# Patient Record
Sex: Female | Born: 1960 | Race: Black or African American | Hispanic: No | Marital: Married | Smoking: Never smoker
Health system: Southern US, Community
[De-identification: ages and names within clinical notes are randomized; demographics above are authoritative.]

## PROBLEM LIST (undated history)

## (undated) DIAGNOSIS — I10 Essential (primary) hypertension: Secondary | ICD-10-CM

## (undated) DIAGNOSIS — I2510 Atherosclerotic heart disease of native coronary artery without angina pectoris: Secondary | ICD-10-CM

## (undated) DIAGNOSIS — J449 Chronic obstructive pulmonary disease, unspecified: Secondary | ICD-10-CM

## (undated) DIAGNOSIS — G4730 Sleep apnea, unspecified: Secondary | ICD-10-CM

## (undated) HISTORY — PX: CARDIAC CATHETERIZATION: SHX172

## (undated) HISTORY — PX: OTHER SURGICAL HISTORY: SHX169

## (undated) HISTORY — PX: DILATION AND CURETTAGE OF UTERUS: SHX78

---

## 1988-01-05 HISTORY — PX: TUBAL LIGATION: SHX77

## 2001-04-10 ENCOUNTER — Ambulatory Visit (HOSPITAL_COMMUNITY): Admission: RE | Admit: 2001-04-10 | Discharge: 2001-04-10 | Payer: Self-pay | Admitting: *Deleted

## 2001-04-10 ENCOUNTER — Encounter: Payer: Self-pay | Admitting: *Deleted

## 2001-06-14 ENCOUNTER — Ambulatory Visit (HOSPITAL_COMMUNITY): Admission: RE | Admit: 2001-06-14 | Discharge: 2001-06-14 | Payer: Self-pay | Admitting: Urology

## 2001-06-14 ENCOUNTER — Encounter: Payer: Self-pay | Admitting: Urology

## 2001-06-16 ENCOUNTER — Encounter: Payer: Self-pay | Admitting: Urology

## 2001-06-16 ENCOUNTER — Ambulatory Visit (HOSPITAL_COMMUNITY): Admission: RE | Admit: 2001-06-16 | Discharge: 2001-06-16 | Payer: Self-pay | Admitting: Urology

## 2001-09-29 ENCOUNTER — Inpatient Hospital Stay (HOSPITAL_COMMUNITY): Admission: EM | Admit: 2001-09-29 | Discharge: 2001-09-30 | Payer: Self-pay | Admitting: Cardiovascular Disease

## 2001-09-29 ENCOUNTER — Encounter (HOSPITAL_COMMUNITY): Admission: RE | Admit: 2001-09-29 | Discharge: 2001-10-29 | Payer: Self-pay | Admitting: Cardiology

## 2001-11-10 ENCOUNTER — Encounter (HOSPITAL_COMMUNITY): Admission: RE | Admit: 2001-11-10 | Discharge: 2001-12-10 | Payer: Self-pay | Admitting: Cardiology

## 2002-04-20 ENCOUNTER — Encounter: Payer: Self-pay | Admitting: Urology

## 2002-04-20 ENCOUNTER — Ambulatory Visit (HOSPITAL_COMMUNITY): Admission: RE | Admit: 2002-04-20 | Discharge: 2002-04-20 | Payer: Self-pay | Admitting: Urology

## 2002-08-23 ENCOUNTER — Emergency Department (HOSPITAL_COMMUNITY): Admission: EM | Admit: 2002-08-23 | Discharge: 2002-08-23 | Payer: Self-pay | Admitting: Emergency Medicine

## 2002-08-23 ENCOUNTER — Encounter: Payer: Self-pay | Admitting: Emergency Medicine

## 2007-07-05 ENCOUNTER — Ambulatory Visit (HOSPITAL_COMMUNITY): Admission: RE | Admit: 2007-07-05 | Discharge: 2007-07-05 | Payer: Self-pay | Admitting: Obstetrics and Gynecology

## 2007-08-07 ENCOUNTER — Encounter: Payer: Self-pay | Admitting: Obstetrics and Gynecology

## 2007-08-07 ENCOUNTER — Ambulatory Visit (HOSPITAL_COMMUNITY): Admission: RE | Admit: 2007-08-07 | Discharge: 2007-08-07 | Payer: Self-pay | Admitting: Obstetrics and Gynecology

## 2007-08-22 ENCOUNTER — Emergency Department (HOSPITAL_COMMUNITY): Admission: EM | Admit: 2007-08-22 | Discharge: 2007-08-22 | Payer: Self-pay | Admitting: Emergency Medicine

## 2007-09-08 ENCOUNTER — Ambulatory Visit (HOSPITAL_COMMUNITY): Admission: RE | Admit: 2007-09-08 | Discharge: 2007-09-08 | Payer: Self-pay | Admitting: Urology

## 2007-09-27 ENCOUNTER — Ambulatory Visit (HOSPITAL_COMMUNITY): Admission: RE | Admit: 2007-09-27 | Discharge: 2007-09-27 | Payer: Self-pay | Admitting: Urology

## 2007-09-28 ENCOUNTER — Ambulatory Visit (HOSPITAL_COMMUNITY): Admission: RE | Admit: 2007-09-28 | Discharge: 2007-09-28 | Payer: Self-pay | Admitting: Urology

## 2010-03-30 ENCOUNTER — Other Ambulatory Visit (HOSPITAL_COMMUNITY)
Admission: RE | Admit: 2010-03-30 | Discharge: 2010-03-30 | Disposition: A | Discharge status: Home / Self Care | Payer: Self-pay | Source: Ambulatory Visit | Attending: Obstetrics and Gynecology | Admitting: Obstetrics and Gynecology

## 2010-03-30 ENCOUNTER — Other Ambulatory Visit: Payer: Self-pay | Admitting: Obstetrics and Gynecology

## 2010-03-30 DIAGNOSIS — Z01419 Encounter for gynecological examination (general) (routine) without abnormal findings: Secondary | ICD-10-CM | POA: Insufficient documentation

## 2010-05-19 NOTE — Op Note (Signed)
NAMETRENITY, PHA               ACCOUNT NO.:  [id number]   MEDICAL RECORD NO.:  [id number]          PATIENT TYPE:  AMB   LOCATION:  DAY                           FACILITY:  [facility]   PHYSICIAN:  Tilda Burrow, M.D. DATE OF BIRTH:  01/04/1961   DATE OF PROCEDURE:  DATE OF DISCHARGE:                               OPERATIVE REPORT   PREOPERATIVE DIAGNOSES:  1. Menorrhagia.  2. Uterine fibroids.   POSTOPERATIVE DIAGNOSES:  1. Menorrhagia.  2. Uterine fibroids.  3. Chronic hypertension, suboptimal controlled.   PROCEDURE:  1. Hysteroscopy.  2. D&C.  3. Endometrial ablation.   SURGEON:  Tilda Burrow, MD   ASSISTANT:  None.   ANESTHESIA:  General with LMA.   COMPLICATIONS:  None.   FINDINGS:  Small uterus deviated to the left by fibroid deformity.  Intramural and submucous fibroids deforming endometrial lining.  Small  endometrial polyp found as well as excised.   DETAILS OF PROCEDURE:  The patient was taken to the operating room,  prepped and draped in the usual fashion.  General anesthesia was  introduced and achieved with the patient's induction notable for  significant blood pressure elevations to as high as 200/100 during the  immediate preop and induction phases.  Analgesia was controlled the  anesthesia intraoperatively.   The vaginal layer was prepped and draped, and the cervix grasped in the  left-sided position sounded to 11 cm and dilated serially with a 25-  [place] allowing introduction of rigid 30-degree operative hysteroscope,  which showed both a small endometrial polyp photographed in photo number  one and some irregular intramural fibroids that deformed the uterine  fundus, particularly on the left side.  On the right side, we were able  to identify the tubal ostia from the right tube.  Curettage was  performed obtaining a small amount of tissue fragments.  Repeat  hysteroscopy confirmed that the polyp had been removed.  The procedure  was then  performed in a standard fashion with Gynecare ThermaChoice III  endometrial ablation device inserted to 11 cm dilated with 8 mL of D5W  and then an 8 minute thermal sequence was performed with 87 degrees  centigrade reached with the intrauterine fluid.  Fluid was removed at  the end of the 8 minutes after completion of the cooling sequence and  all 8 mL were recovered.  Paracervical block with 20 mL of 1% Marcaine  solution with epinephrine was then injected around the cervix.  The  patient was then allowed to go to the recovery room in stable condition.   ESTIMATED BLOOD LOSS:  Minimal.   CONDITION IN THE RECOVERY ROOM:  Stable.   ADDENDUM:  Blood pressure issues will be brought to the attention of  [facility] for continued monitoring of blood  pressures and consideration of increasing dosing.  Weight loss of course  would be helpful.      Tilda Burrow, M.D.  Electronically Signed     JVF/MEDQ  D:  08/07/2007  T:  08/08/2007  Job:  [id number]   cc:   [facility]
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
## 2010-05-19 NOTE — H&P (Signed)
[name], HOWSER               ACCOUNT NO.:  [id number]   MEDICAL RECORD NO.:  [id number]          PATIENT TYPE:  AMB   LOCATION:  DAY                           FACILITY:  [facility]   PHYSICIAN:  Dennie Maizes, M.D.   DATE OF BIRTH:  29-Dec-1960   DATE OF ADMISSION:  09/27/2007  DATE OF DISCHARGE:  LH                              HISTORY & PHYSICAL   CHIEF COMPLAINT:  Intermittent right flank pain, right distal ureteral  calculus with obstruction.   HISTORY OF PRESENT ILLNESS:  This 50 year old female is known to me from  prior evaluation and treatment.  She has a history of recurrent  urolithiasis.  She has undergone ESL of right renal calculus in 2003.   She has been having intermittent right flank pain for about 2 weeks.  The pain became severe and she went to the emergency room last month.  She has been evaluated with a noncontrast CT scan of abdomen and pelvis.  This revealed a 5-mm size right distal ureteral calculi without  obstruction or hydronephrosis.  The patient has not passed a stone.  She  returned to the office and further evaluation and treatment.  There is  no history of fever, chills, voiding difficulty or gross hematuria.  X-  ray KUB area was done in the diagnostic center.  This revealed a 5 x 6-  mm size stone in the right distal ureteral area.  The patient is brought  to the short-stay center today for extracorporeal shock wave lithotripsy  of right distal ureteral calculus.   PAST MEDICAL HISTORY:  1. History of recurrent urolithiasis status post ESL of right renal      calculus in 2003.  2. History of type 2 diabetes mellitus.  3. Heart disease.  4. Hypertension.  5. Elevated cholesterol.   MEDICATIONS:  1. Metformin 1000 mg twice a day.  2. Pravastatin 20 mg at bedtime.  3. Multivitamins.  4. Hyzaar 100/12.7 p.o. daily.  5. Glipizide 10 mg twice a day.  6. Plavix 75 mg one p.o. daily.  7. Hydroxyzine hydrochloride 10 mg twice a day.  8. Carvedilol  6.25 mg twice a day.  9. Plavix has been stopped for a week prior to the procedure.   ALLERGIES:  None   PHYSICAL EXAMINATION:  GENERAL:  The patient is comfortable at time of  examination.  HEAD, EYES, EARS, NOSE AND THROAT:  Normal.  LUNGS:  Clear to auscultation.  HEART:  Regular rate and rhythm.  No murmurs.  ABDOMEN:  Soft.  No palpable flank mass or CVA tenderness.  Bladder is  not palpable.   IMPRESSION:  Right distal ureteral calculus with obstruction, right  renal colic, right hydronephrosis.   PLAN:  I have discussed with the patient regarding management options.  She would like to be treated with ESL.  Extracorporeal shock wave  lithotripsy of right distal ureteral calculus with IV sedation will be  done at [facility].  I have informed the patient regarding  diagnosis, operative details, alternative treatments, outcome, possible  risks and complications and she has agreed for the  procedure to be done.      Dennie Maizes, M.D.  Electronically Signed     SK/MEDQ  D:  09/26/2007  T:  09/26/2007  Job:  [id number]   cc:   [facility]  [place], [place]   Short stay Center
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
## 2010-05-22 NOTE — Procedures (Signed)
   NAME:  [name], [name]                          ACCOUNT NO.:  [id number]   MEDICAL RECORD NO.:  [id number]                   PATIENT TYPE:  OUT   LOCATION:  RAD                                  FACILITY:  [facility]   PHYSICIAN:  [place] Bing, M.D. [facility]           DATE OF BIRTH:  02-23-1960   DATE OF PROCEDURE:  DATE OF DISCHARGE:                                    STRESS TEST   EXERCISE CARDIOLITE:   BRIEF HISTORY:  The patient is a pleasant 50 year old female with a history  of coronary artery disease.  She underwent cardiac catheterization September  2003.  She was found to have a subtotal RCA that was not treated at that  time.  She had significant disease in her LAD and received two stents to the  LAD.  She had normal LV function.  She was recently seen in the office by  Dr. Dietrich Pates on October 27, 2001 for evaluation of chest pain and dyspnea.  She was scheduled for an exercise Cardiolite.   The patient reports having some chest pain and dyspnea the day prior to this  study however, she feels fine today.  Her baseline EKG showed sinus  bradycardia rate 46 beats per minute without ischemic changes, target heart  rate was 153 beats per minute, resting blood pressure is 118/80.   The patient was able to exercise for 5 minutes and 49 seconds reaching a  maximum heart rate of 140 beats per minute which was less than targeted  heart rate of 153 beats per minute.  She was injected at 4 minutes and 53  seconds into the study at which time her heart rate was 139 beats per  minute.  Her blood pressure was elevated at 200/90; this did return to a  more normal range of 156/88 in recovery.   There were no EKG changes.  The patient did have some shortness of breath  and fatigue as well as dizziness at the conclusion of the study however, she  had no chest pain and there were no significant EKG changes.  The images are  pending at time of this dictation, however, as noted, this was a  submaximal  study in that she did not reach her targeted heart rate.     Delton See, P.A. [facility]                  [place] Bing, M.D. [facility]    DR/MEDQ  D:  11/10/2001  T:  11/11/2001  Job:  [id number]   cc:   Colan Neptune, M.D.  [facility]  [address]  [place], [place] [address]
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
## 2010-05-22 NOTE — Discharge Summary (Signed)
NAME:  [name], [name]                          ACCOUNT NO.:  [id number]   MEDICAL RECORD NO.:  [id number]                   PATIENT TYPE:  INP   LOCATION:  6533                                 FACILITY:  [facility]   PHYSICIAN:  Joellyn Rued, P.A. [facility]              DATE OF BIRTH:  1960-02-02   DATE OF ADMISSION:  09/29/2001  DATE OF DISCHARGE:  09/30/2001                           DISCHARGE SUMMARY - REFERRING   HISTORY OF PRESENT ILLNESS:  The patient is a 50 year old black female who  was referred for an outpatient stress Cardiolite by her primary care  physician, Dr. Fredderick Severance of [facility]. The patient  describes  a history of anterior sharp chest discomfort radiating to  bilateral arm numbness associated with nausea, shortness of breath and  diaphoresis. Her first episode was approximately two months ago when she was  evaluated and released from the [facility] emergency room (records  unavailable).  She told to follow up with her primary care physician, which  she did approximately one month ago. Her primary care doctor in turn  referred her to Dr. Dietrich Pates for an outpatient stress  Cardiolite. It is  noted that she has not been seen in our office.   Since the initial presentation of chest  discomfort, the patient has  continued to have two to three episodes of chest discomfort a day. They  occur any time, however, she denies any nocturnal episodes. The duration of  the discomfort is usually less than 15 minutes, and she obtains relief with  rest and aspirin.   During her stress test, she exhibited severe chest discomfort reminiscent of  her prior symptoms. Her EKG at the beginning of stage 2 showed 2 to 2 mm ST  segment elevation in V3 through V6. The treadmill  was discontinued. She was  placed on oxygen. Her symptoms resolved  within less than  one to two  minutes and her EKGs returned to baseline. Dr. Daleen Squibb was informed and she was  transferred to [facility] emergently for cardiac catheterization.   PAST MEDICAL HISTORY:  Her history is notable for:  1. Cesarean section.  2. Bilateral tubal ligation.  3. Treatment for bronchitis.   LABORATORY DATA:  Labs were drawn initially in [facility] emergency room.  This showed an H&H of 12.6 and 37.4, normal indices, platelets 349, WBCs  4.6. PT 12.7, PTT 23. Sodium 138, potassium 39, BUN 6, creatinine 0.7.  Normal liver function tests. Serial CK isoenzymes were negative for  myocardial infarctions, two troponins were negative for myocardial  infarction. At the time of discharge H&H  was 10.7 and 32.0, normal indices,  platelets 231, WBCs 5.0. Sodium 134, potassium 34, BUN 10, creatinine 0.6.   HOSPITAL COURSE:  At [facility] she was placed on IV heparin and IV  nitroglycerin and given  four baby aspirin. She was transferred to [facility] via [facility] and taken immediately  to the cardiac  catheterization laboratory by Dr. Chales Abrahams. Her ejection fraction was 55%. She  had a 40% proximal RCA, a distal 100% PLA. She had a 30% mid circumflex. She  had an 80% proximal LAD and a 70% mid LAD. Dr. Chales Abrahams opened both LAD lesions  with a Zeta and a Cypher stent, reducing the lesions to 20% and 0%  respectively.  [place] resource placed  her on the jumbo study.   Post sheath removal she was on bedrest. She ambulating the halls without  difficulty. Cardiac rehab assisted with ambulation and education. Dr. Graciela Husbands,  after review on  September 30, 2001, felt that she could be discharged home.   DISCHARGE DIAGNOSIS:  Unstable angina, positive stress test with V3 through  V6 ST segment elevation (Cardiolite imaging was not performed). Coronary  artery disease as described, status post angioplasty stenting of the LAD.   DISPOSITION:  She is discharged home. It is noted that at the time of  discharge her lipid panel is pending.   DISCHARGE MEDICATIONS:  The jumbo study drug x 30 days. She  was instructed  no proton pump inhibitors and no Plavix while on the study drug. When she  finishes the study drug she will start:  1. Plavix 75 mg q.d.  2. She is asked to begin taking a coated aspirin 325 mg q.d.  3. Norvasc 5 mg q.d.  4. Nitroglycerin p.r.n.  5. Zocor 20 mg q.h.s.  6. She was given permission to continue her Ditropan XL  5 mg q.d.   DISCHARGE INSTRUCTIONS:  She was advised no lifting, driving, sexual  activity or heavy exertion for two days, no work for one week. No MI for two  months. Antibiotics with any procedures for  the next three months. Maintain  low fat, low salt, low cholesterol diet. If she had any problems with her  catheterization site she was asked to call. She was instructed not to take  her hydrochlorothiazide. We will have Dr. Elmer Ramp draw fasting lipids  and liver function tests in approximately six weeks and send to  our office  in [place].   FOLLOW UP:  She will have a research followup  on October 26, 2001, at 10  a.m. in the [place] office. She was asked to call  our [place] office  to arrange a one to two week followup appointment with Dr. Dietrich Pates or  myself.                                                 Joellyn Rued, P.A. [facility]    EW/MEDQ  D:  09/30/2001  T:  10/03/2001  Job:  [id number]   cc:   Dr. Linna Hoff [facility]

## 2010-10-02 LAB — CBC
MCHC: 33.3
MCV: 83.5
Platelets: 272
RDW: 14.1

## 2010-10-02 LAB — BASIC METABOLIC PANEL
BUN: 4 — ABNORMAL LOW
CO2: 28
Calcium: 9.4
Chloride: 104
Creatinine, Ser: 0.59
Glucose, Bld: 114 — ABNORMAL HIGH

## 2010-10-02 LAB — HCG, QUANTITATIVE, PREGNANCY: hCG, Beta Chain, Quant, S: <2

## 2010-10-05 LAB — BASIC METABOLIC PANEL
BUN: 9
Calcium: 9.2
Chloride: 102
Creatinine, Ser: 0.61
GFR calc Af Amer: >60
GFR calc non Af Amer: >60

## 2010-10-05 LAB — HCG, QUANTITATIVE, PREGNANCY: hCG, Beta Chain, Quant, S: <2

## 2010-12-17 ENCOUNTER — Inpatient Hospital Stay: Payer: Self-pay | Admitting: Internal Medicine

## 2011-01-07 ENCOUNTER — Emergency Department: Payer: Self-pay | Admitting: *Deleted

## 2011-04-08 ENCOUNTER — Other Ambulatory Visit: Payer: Self-pay | Admitting: Obstetrics and Gynecology

## 2011-04-09 ENCOUNTER — Encounter (HOSPITAL_COMMUNITY): Payer: Self-pay

## 2011-04-09 ENCOUNTER — Other Ambulatory Visit: Payer: Self-pay

## 2011-04-09 ENCOUNTER — Encounter (HOSPITAL_COMMUNITY)
Admission: RE | Admit: 2011-04-09 | Discharge: 2011-04-09 | Disposition: A | Discharge status: Home / Self Care | Payer: PRIVATE HEALTH INSURANCE | Source: Ambulatory Visit | Attending: Obstetrics and Gynecology | Admitting: Obstetrics and Gynecology

## 2011-04-09 HISTORY — DX: Essential (primary) hypertension: I10

## 2011-04-09 HISTORY — DX: Sleep apnea, unspecified: G47.30

## 2011-04-09 HISTORY — DX: Chronic obstructive pulmonary disease, unspecified: J44.9

## 2011-04-09 LAB — URINALYSIS, ROUTINE W REFLEX MICROSCOPIC
Leukocytes, UA: NEGATIVE
Nitrite: NEGATIVE
Protein, ur: NEGATIVE mg/dL
Specific Gravity, Urine: 1.02 (ref 1.005–1.030)
Urobilinogen, UA: 0.2 mg/dL (ref 0.0–1.0)

## 2011-04-09 LAB — SURGICAL PCR SCREEN
MRSA, PCR: NEGATIVE
Staphylococcus aureus: NEGATIVE

## 2011-04-09 LAB — CBC
HCT: 31 % — ABNORMAL LOW (ref 36.0–46.0)
Hemoglobin: 9.8 g/dL — ABNORMAL LOW (ref 12.0–15.0)
RDW: 16.3 % — ABNORMAL HIGH (ref 11.5–15.5)
WBC: 5.8 10*3/uL (ref 4.0–10.5)

## 2011-04-09 LAB — HCG, SERUM, QUALITATIVE: Preg, Serum: NEGATIVE

## 2011-04-09 LAB — BASIC METABOLIC PANEL
Chloride: 102 mEq/L (ref 96–112)
Creatinine, Ser: 0.65 mg/dL (ref 0.50–1.10)
GFR calc Af Amer: >90 mL/min (ref >90)
GFR calc non Af Amer: >90 mL/min (ref >90)
Potassium: 3.6 mEq/L (ref 3.5–5.1)

## 2011-04-09 NOTE — Progress Notes (Signed)
0400 Spoke to Dr. Emelda Fear about patient taking Aspirin and Plavix. Gave order for patient to discontinue  Plavix and Aspirin after Friday, April 5 dose for surgery on April 9.

## 2011-04-09 NOTE — Patient Instructions (Addendum)
20 [name]  04/09/2011   Your procedure is scheduled on:  Tuesday, April 13, 2011  [facility] to [facility] at [id number].  Call this number if you have problems the morning of surgery: [phone]   Remember:   Do not eat food:After Midnight.  May have clear liquids:until Midnight .  Clear liquids include soda, tea, black coffee, apple or grape juice, broth.  Take these medicines the morning of surgery with A SIP OF WATER:    Do not wear jewelry, make-up or nail polish.  Do not wear lotions, powders, or perfumes. You may wear deodorant.  Do not shave 48 hours prior to surgery.  Do not bring valuables to the hospital.  Contacts, dentures or bridgework may not be worn into surgery.  Leave suitcase in the car. After surgery it may be brought to your room.  For patients admitted to the hospital, checkout time is 11:00 AM the day of discharge.   Patients discharged the day of surgery will not be allowed to drive home.  Name and phone number of your driver: Family  Special Instructions: CHG Shower Use Special Wash: 1/2 bottle night before surgery and 1/2 bottle morning of surgery.   Please read over the following fact sheets that you were given: Pain Booklet, Coughing and Deep Breathing, Blood Transfusion Information, MRSA Information, Surgical Site Infection Prevention, Anesthesia Post-op Instructions and Care and Recovery After Surgery   PATIENT INSTRUCTIONS POST-ANESTHESIA  IMMEDIATELY FOLLOWING SURGERY:  Do not drive or operate machinery for the first twenty four hours after surgery.  Do not make any important decisions for twenty four hours after surgery or while taking narcotic pain medications or sedatives.  If you develop intractable nausea and vomiting or a severe headache please notify your doctor immediately.  FOLLOW-UP:  Please make an appointment with your surgeon as instructed. You do not need to follow up with anesthesia unless specifically instructed to do so.  WOUND CARE  INSTRUCTIONS (if applicable):  Keep a dry clean dressing on the anesthesia/puncture wound site if there is drainage.  Once the wound has quit draining you may leave it open to air.  Generally you should leave the bandage intact for twenty four hours unless there is drainage.  If the epidural site drains for more than 36-48 hours please call the anesthesia department.  QUESTIONS?:  Please feel free to call your physician or the hospital operator if you have any questions, and they will be happy to assist you.      Hysterectomy Information  A hysterectomy is a procedure where your uterus is surgically removed. It will no longer be possible to have menstrual periods or to become pregnant. The tubes and ovaries can be removed (bilateral salpingo-oopherectomy) during this surgery as well.  REASONS FOR A HYSTERECTOMY  Persistent, abnormal bleeding.   Lasting (chronic) pelvic pain or infection.   The lining of the uterus (endometrium) starts growing outside the uterus (endometriosis).   The endometrium starts growing in the muscle of the uterus (adenomyosis).   The uterus falls down into the vagina (pelvic organ prolapse).   Symptomatic uterine fibroids.   Precancerous cells.   Cervical cancer or uterine cancer.  TYPES OF HYSTERECTOMIES  Supracervical hysterectomy. This type removes the top part of the uterus, but not the cervix.   Total hysterectomy. This type removes the uterus and cervix.   Radical hysterectomy. This type removes the uterus, cervix, and the fibrous tissue that holds the uterus in place in the pelvis (  parametrium).  WAYS A HYSTERECTOMY CAN BE PERFORMED  Abdominal hysterectomy. A large surgical cut (incision) is made in the abdomen. The uterus is removed through this incision.   Vaginal hysterectomy. An incision is made in the vagina. The uterus is removed through this incision. There are no abdominal incisions.   Conventional laparoscopic hysterectomy. A thin, lighted  tube with a camera (laparoscope) is inserted into 3 or 4 small incisions in the abdomen. The uterus is cut into small pieces. The small pieces are removed through the incisions, or they are removed through the vagina.   Laparoscopic assisted vaginal hysterectomy (LAVH). Three or four small incisions are made in the abdomen. Part of the surgery is performed laparoscopically and part vaginally. The uterus is removed through the vagina.   Robot-assisted laparoscopic hysterectomy. A laparoscope is inserted into 3 or 4 small incisions in the abdomen. A computer-controlled device is used to give the surgeon a 3D image. This allows for more precise movements of surgical instruments. The uterus is cut into small pieces and removed through the incisions or removed through the vagina.  RISKS OF HYSTERECTOMY   Bleeding and risk of blood transfusion. Tell your caregiver if you do not want to receive any blood products.   Blood clots in the legs or lung.   Infection.   Injury to surrounding organs.   Anesthesia problems or side effects.   Conversion to an abdominal hysterectomy.  WHAT TO EXPECT AFTER A HYSTERECTOMY  You will be given pain medicine.   You will need to have someone with you for the first 3 to 5 days after you go home.   You will need to follow up with your surgeon in 2 to 4 weeks after surgery to evaluate your progress.   You may have early menopause symptoms like hot flashes, night sweats, and insomnia.   If you had a hysterectomy for a problem that was not a cancer or a condition that could lead to cancer, then you no longer need Pap tests. However, even if you no longer need a Pap test, a regular exam is a good idea to make sure no other problems are starting.  Document Released: 06/16/2000 Document Revised: 12/10/2010 Document Reviewed: 08/01/2010 [facility] Patient Information 2012 [place], [place].

## 2011-04-13 ENCOUNTER — Encounter (HOSPITAL_COMMUNITY)
Admission: RE | Disposition: A | Discharge status: Home / Self Care | Payer: Self-pay | Source: Ambulatory Visit | Attending: Obstetrics and Gynecology

## 2011-04-13 ENCOUNTER — Ambulatory Visit (HOSPITAL_COMMUNITY)
Admission: RE | Admit: 2011-04-13 | Discharge: 2011-04-13 | Disposition: A | Discharge status: Home / Self Care | Payer: PRIVATE HEALTH INSURANCE | Source: Ambulatory Visit | Attending: Obstetrics and Gynecology | Admitting: Obstetrics and Gynecology

## 2011-04-13 ENCOUNTER — Encounter (HOSPITAL_COMMUNITY): Payer: Self-pay | Admitting: Anesthesiology

## 2011-04-13 ENCOUNTER — Other Ambulatory Visit: Payer: Self-pay | Admitting: Obstetrics and Gynecology

## 2011-04-13 ENCOUNTER — Encounter (HOSPITAL_COMMUNITY): Payer: Self-pay | Admitting: *Deleted

## 2011-04-13 DIAGNOSIS — Z01812 Encounter for preprocedural laboratory examination: Secondary | ICD-10-CM | POA: Insufficient documentation

## 2011-04-13 DIAGNOSIS — N920 Excessive and frequent menstruation with regular cycle: Secondary | ICD-10-CM | POA: Insufficient documentation

## 2011-04-13 DIAGNOSIS — Z5309 Procedure and treatment not carried out because of other contraindication: Secondary | ICD-10-CM | POA: Insufficient documentation

## 2011-04-13 LAB — ABO/RH: ABO/RH(D): B POS

## 2011-04-13 SURGERY — HYSTERECTOMY, ABDOMINAL
Anesthesia: General

## 2011-04-13 MED ORDER — CEFAZOLIN SODIUM 1-5 GM-% IV SOLN
INTRAVENOUS | Status: AC
  Filled 2011-04-13: qty 100

## 2011-04-13 MED ORDER — ROCURONIUM BROMIDE 50 MG/5ML IV SOLN
INTRAVENOUS | Status: AC
  Filled 2011-04-13: qty 1

## 2011-04-13 MED ORDER — PROPOFOL 10 MG/ML IV EMUL
INTRAVENOUS | Status: AC
  Filled 2011-04-13: qty 20

## 2011-04-13 MED ORDER — LIDOCAINE HCL (PF) 1 % IJ SOLN
INTRAMUSCULAR | Status: AC
  Filled 2011-04-13: qty 5

## 2011-04-13 MED ORDER — CEFAZOLIN SODIUM-DEXTROSE 2-3 GM-% IV SOLR: 2.0000 g | INTRAVENOUS | Status: DC

## 2011-04-13 SURGICAL SUPPLY — 77 items
ADH SKN CLS APL DERMABOND .7 (GAUZE/BANDAGES/DRESSINGS)
APL SKNCLS STERI-STRIP NONHPOA (GAUZE/BANDAGES/DRESSINGS)
APPLIER CLIP 11 MED OPEN (CLIP)
APPLIER CLIP 13 LRG OPEN (CLIP)
APR CLP LRG 13 20 CLIP (CLIP)
APR CLP MED 11 20 MLT OPN (CLIP)
BAG HAMPER (MISCELLANEOUS) ×1 IMPLANT
BENZOIN TINCTURE PRP APPL 2/3 (GAUZE/BANDAGES/DRESSINGS) IMPLANT
BLADE SURG SZ11 CARB STEEL (BLADE) IMPLANT
BRR ADH 6X5 SEPRAFILM 1 SHT (MISCELLANEOUS)
CELLS DAT CNTRL 66122 CELL SVR (MISCELLANEOUS) IMPLANT
CLIP APPLIE 11 MED OPEN (CLIP) IMPLANT
CLIP APPLIE 13 LRG OPEN (CLIP) IMPLANT
CLOTH BEACON ORANGE TIMEOUT ST (SAFETY) ×1 IMPLANT
COVER SURGICAL LIGHT HANDLE (MISCELLANEOUS) ×2 IMPLANT
DERMABOND ADVANCED (GAUZE/BANDAGES/DRESSINGS)
DERMABOND ADVANCED .7 DNX12 (GAUZE/BANDAGES/DRESSINGS) IMPLANT
DRAPE WARM FLUID 44X44 (DRAPE) ×1 IMPLANT
DRESSING TELFA 8X3 (GAUZE/BANDAGES/DRESSINGS) ×1 IMPLANT
ELECT REM PT RETURN 9FT ADLT (ELECTROSURGICAL)
ELECTRODE REM PT RTRN 9FT ADLT (ELECTROSURGICAL) ×1 IMPLANT
EVACUATOR DRAINAGE 10X20 100CC (DRAIN) IMPLANT
EVACUATOR SILICONE 100CC (DRAIN)
FORMALIN 10 PREFIL 120ML (MISCELLANEOUS) ×1 IMPLANT
FORMALIN 10 PREFIL 480ML (MISCELLANEOUS) ×1 IMPLANT
GLOVE ECLIPSE 9.0 STRL (GLOVE) ×1 IMPLANT
GLOVE INDICATOR STER SZ 9 (GLOVE) IMPLANT
GOWN STRL REIN 3XL LVL4 (GOWN DISPOSABLE) IMPLANT
GOWN STRL REIN XL XLG (GOWN DISPOSABLE) ×2 IMPLANT
INST SET MAJOR GENERAL (KITS) IMPLANT
INST SET MINOR GENERAL (KITS) IMPLANT
KIT ROOM TURNOVER APOR (KITS) ×1 IMPLANT
MANIFOLD NEPTUNE II (INSTRUMENTS) ×1 IMPLANT
NDL HYPO 25X1 1.5 SAFETY (NEEDLE) ×1 IMPLANT
NEEDLE HYPO 25X1 1.5 SAFETY (NEEDLE) IMPLANT
NS IRRIG 1000ML POUR BTL (IV SOLUTION) IMPLANT
PACK ABDOMINAL MAJOR (CUSTOM PROCEDURE TRAY) ×1 IMPLANT
PACK MINOR (CUSTOM PROCEDURE TRAY) ×1 IMPLANT
PAD ARMBOARD 7.5X6 YLW CONV (MISCELLANEOUS) ×1 IMPLANT
RETRACTOR WND ALEXIS 18 MED (MISCELLANEOUS) IMPLANT
RETRACTOR WND ALEXIS 25 LRG (MISCELLANEOUS) IMPLANT
RTRCTR WOUND ALEXIS 18CM MED (MISCELLANEOUS)
RTRCTR WOUND ALEXIS 25CM LRG (MISCELLANEOUS)
SEPRAFILM MEMBRANE 5X6 (MISCELLANEOUS) IMPLANT
SET BASIN LINEN APH (SET/KITS/TRAYS/PACK) IMPLANT
SOL PREP PROV IODINE SCRUB 4OZ (MISCELLANEOUS) ×1 IMPLANT
SPONGE DRAIN TRACH 4X4 STRL 2S (GAUZE/BANDAGES/DRESSINGS) IMPLANT
SPONGE GAUZE 2X2 8PLY STRL LF (GAUZE/BANDAGES/DRESSINGS) ×1 IMPLANT
SPONGE GAUZE 4X4 12PLY (GAUZE/BANDAGES/DRESSINGS) IMPLANT
SPONGE LAP 18X18 X RAY DECT (DISPOSABLE) IMPLANT
STAPLER VISISTAT (STAPLE) ×1 IMPLANT
STAPLER VISISTAT 35W (STAPLE) IMPLANT
STRIP CLOSURE SKIN 1/2X4 (GAUZE/BANDAGES/DRESSINGS) ×2 IMPLANT
SUT CHROMIC 0 CT 1 (SUTURE) IMPLANT
SUT CHROMIC 2 0 CT 1 (SUTURE) IMPLANT
SUT CHROMIC GUT BROWN 0 54 (SUTURE) IMPLANT
SUT CHROMIC GUT BROWN 0 54IN (SUTURE)
SUT ETHIBOND NAB MO 7 #0 18IN (SUTURE) ×1 IMPLANT
SUT ETHILON 3 0 FSL (SUTURE) IMPLANT
SUT PDS AB CT VIOLET #0 27IN (SUTURE) IMPLANT
SUT PLAIN CT 1/2CIR 2-0 27IN (SUTURE) IMPLANT
SUT PROLENE 0 CT 1 30 (SUTURE) IMPLANT
SUT VIC AB 0 CT1 27 (SUTURE)
SUT VIC AB 0 CT1 27XBRD ANTBC (SUTURE) IMPLANT
SUT VIC AB 2-0 CT1 27 (SUTURE)
SUT VIC AB 2-0 CT1 TAPERPNT 27 (SUTURE) IMPLANT
SUT VIC AB 2-0 CT2 27 (SUTURE) ×1 IMPLANT
SUT VIC AB 3-0 SH 27 (SUTURE)
SUT VIC AB 3-0 SH 27X BRD (SUTURE) ×1 IMPLANT
SUT VIC AB 4-0 PS2 27 (SUTURE) ×1 IMPLANT
SUT VIC AB 5-0 P-3 18X BRD (SUTURE) IMPLANT
SUT VIC AB 5-0 P3 18 (SUTURE)
SUT VICRYL 4 0 KS 27 (SUTURE) ×1 IMPLANT
SUT VICRYL AB 3 0 TIES (SUTURE) IMPLANT
SYR CONTROL 10ML LL (SYRINGE) ×1 IMPLANT
TOWEL BLUE STERILE X RAY DET (MISCELLANEOUS) IMPLANT
TRAY FOLEY CATH 14FR (SET/KITS/TRAYS/PACK) IMPLANT

## 2011-04-13 NOTE — Anesthesia Preprocedure Evaluation (Signed)
Anesthesia Evaluation  Patient identified by MRN, date of birth, ID band Patient awake    Reviewed: Allergy & Precautions, H&P , NPO status , Patient's Chart, lab work & pertinent test results  History of Anesthesia Complications Negative for: history of anesthetic complications  Airway Mallampati: II      Dental  (+) Edentulous Upper   Pulmonary asthma , sleep apnea , COPD breath sounds clear to auscultation        Cardiovascular hypertension, Pt. on medications + CAD and + Cardiac Stents Rhythm:Regular Rate:Normal     Neuro/Psych    GI/Hepatic   Endo/Other  Diabetes mellitus-, Well Controlled, Type 2, Oral Hypoglycemic Agents  Renal/GU      Musculoskeletal   Abdominal   Peds  Hematology   Anesthesia Other Findings   Reproductive/Obstetrics                           Anesthesia Physical Anesthesia Plan  ASA: III  Anesthesia Plan: General   Post-op Pain Management:    Induction: Intravenous  Airway Management Planned: Oral ETT  Additional Equipment:   Intra-op Plan:   Post-operative Plan: Extubation in OR  Informed Consent: I have reviewed the patients History and Physical, chart, labs and discussed the procedure including the risks, benefits and alternatives for the proposed anesthesia with the patient or authorized representative who has indicated his/her understanding and acceptance.     Plan Discussed with:   Anesthesia Plan Comments:         Anesthesia Quick Evaluation

## 2011-04-13 NOTE — Brief Op Note (Signed)
Dr  Emelda Fear in to talk with pt surgery canceled due to pt  Not stopping plavix  With 10 days  Pt to be rescheduled at a later date

## 2011-04-15 ENCOUNTER — Encounter (HOSPITAL_COMMUNITY)
Admission: RE | Admit: 2011-04-15 | Discharge: 2011-04-15 | Payer: PRIVATE HEALTH INSURANCE | Source: Ambulatory Visit | Admitting: Obstetrics and Gynecology

## 2011-04-15 NOTE — Pre-Procedure Instructions (Addendum)
Called patient to notify her of time and date of surgery. Also instructed her on NPO after midnight.Patient instructed to take Coreg with a sip of water and to take albuterol inhaler before she comes and bring it with her. Patient also aware of bowel prep that she states Dr Emelda Fear instructed her on. Patient verbalized understanding of all information.

## 2011-04-19 ENCOUNTER — Inpatient Hospital Stay (HOSPITAL_COMMUNITY)
Admission: RE | Admit: 2011-04-19 | Discharge: 2011-04-21 | DRG: 742 | Disposition: A | Discharge status: Home / Self Care | Payer: PRIVATE HEALTH INSURANCE | Source: Ambulatory Visit | Attending: Obstetrics and Gynecology | Admitting: Obstetrics and Gynecology

## 2011-04-19 ENCOUNTER — Encounter (HOSPITAL_COMMUNITY): Payer: Self-pay | Admitting: *Deleted

## 2011-04-19 ENCOUNTER — Encounter (HOSPITAL_COMMUNITY)
Admission: RE | Disposition: A | Discharge status: Home / Self Care | Payer: Self-pay | Source: Ambulatory Visit | Attending: Obstetrics and Gynecology

## 2011-04-19 ENCOUNTER — Encounter (HOSPITAL_COMMUNITY): Payer: Self-pay | Admitting: Anesthesiology

## 2011-04-19 ENCOUNTER — Inpatient Hospital Stay (HOSPITAL_COMMUNITY): Payer: PRIVATE HEALTH INSURANCE | Admitting: Anesthesiology

## 2011-04-19 DIAGNOSIS — E119 Type 2 diabetes mellitus without complications: Secondary | ICD-10-CM | POA: Diagnosis present

## 2011-04-19 DIAGNOSIS — I2510 Atherosclerotic heart disease of native coronary artery without angina pectoris: Secondary | ICD-10-CM | POA: Diagnosis present

## 2011-04-19 DIAGNOSIS — G4730 Sleep apnea, unspecified: Secondary | ICD-10-CM | POA: Diagnosis present

## 2011-04-19 DIAGNOSIS — Z7982 Long term (current) use of aspirin: Secondary | ICD-10-CM

## 2011-04-19 DIAGNOSIS — K429 Umbilical hernia without obstruction or gangrene: Secondary | ICD-10-CM | POA: Diagnosis present

## 2011-04-19 DIAGNOSIS — N8000 Endometriosis of the uterus, unspecified: Principal | ICD-10-CM | POA: Diagnosis present

## 2011-04-19 DIAGNOSIS — Z6841 Body Mass Index (BMI) 40.0 and over, adult: Secondary | ICD-10-CM

## 2011-04-19 DIAGNOSIS — I10 Essential (primary) hypertension: Secondary | ICD-10-CM | POA: Diagnosis present

## 2011-04-19 DIAGNOSIS — N938 Other specified abnormal uterine and vaginal bleeding: Secondary | ICD-10-CM | POA: Diagnosis present

## 2011-04-19 DIAGNOSIS — N926 Irregular menstruation, unspecified: Secondary | ICD-10-CM | POA: Diagnosis present

## 2011-04-19 DIAGNOSIS — Z9861 Coronary angioplasty status: Secondary | ICD-10-CM

## 2011-04-19 DIAGNOSIS — N949 Unspecified condition associated with female genital organs and menstrual cycle: Secondary | ICD-10-CM | POA: Diagnosis present

## 2011-04-19 DIAGNOSIS — E669 Obesity, unspecified: Secondary | ICD-10-CM | POA: Diagnosis present

## 2011-04-19 DIAGNOSIS — N856 Intrauterine synechiae: Secondary | ICD-10-CM | POA: Diagnosis present

## 2011-04-19 DIAGNOSIS — Z9851 Tubal ligation status: Secondary | ICD-10-CM

## 2011-04-19 DIAGNOSIS — D251 Intramural leiomyoma of uterus: Secondary | ICD-10-CM | POA: Diagnosis present

## 2011-04-19 DIAGNOSIS — J449 Chronic obstructive pulmonary disease, unspecified: Secondary | ICD-10-CM | POA: Diagnosis present

## 2011-04-19 DIAGNOSIS — K432 Incisional hernia without obstruction or gangrene: Secondary | ICD-10-CM | POA: Diagnosis present

## 2011-04-19 DIAGNOSIS — D259 Leiomyoma of uterus, unspecified: Secondary | ICD-10-CM | POA: Diagnosis present

## 2011-04-19 DIAGNOSIS — Z79899 Other long term (current) drug therapy: Secondary | ICD-10-CM

## 2011-04-19 DIAGNOSIS — Z7902 Long term (current) use of antithrombotics/antiplatelets: Secondary | ICD-10-CM

## 2011-04-19 DIAGNOSIS — N946 Dysmenorrhea, unspecified: Secondary | ICD-10-CM | POA: Diagnosis present

## 2011-04-19 DIAGNOSIS — J4489 Other specified chronic obstructive pulmonary disease: Secondary | ICD-10-CM | POA: Diagnosis present

## 2011-04-19 DIAGNOSIS — Z90710 Acquired absence of both cervix and uterus: Secondary | ICD-10-CM

## 2011-04-19 HISTORY — PX: UMBILICAL HERNIA REPAIR: SHX196

## 2011-04-19 HISTORY — PX: ABDOMINAL HYSTERECTOMY: SHX81

## 2011-04-19 LAB — PREPARE RBC (CROSSMATCH)

## 2011-04-19 LAB — GLUCOSE, CAPILLARY
Glucose-Capillary: 141 mg/dL — ABNORMAL HIGH (ref 70–99)
Glucose-Capillary: 185 mg/dL — ABNORMAL HIGH (ref 70–99)

## 2011-04-19 LAB — HEMOGLOBIN: Hemoglobin: 12.9 g/dL (ref 12.0–15.0)

## 2011-04-19 SURGERY — HYSTERECTOMY, ABDOMINAL
Anesthesia: General | Site: Abdomen | Wound class: Clean Contaminated

## 2011-04-19 MED ORDER — CEFAZOLIN SODIUM-DEXTROSE 2-3 GM-% IV SOLR: 2.0000 g | Freq: Once | INTRAVENOUS | Status: DC

## 2011-04-19 MED ORDER — DIPHENHYDRAMINE HCL 50 MG/ML IJ SOLN
25.0000 mg | Freq: Once | INTRAMUSCULAR | Status: AC
  Administered 2011-04-19: 25 mg via INTRAVENOUS

## 2011-04-19 MED ORDER — GLYCOPYRROLATE 0.2 MG/ML IJ SOLN
INTRAMUSCULAR | Status: AC
  Filled 2011-04-19: qty 3

## 2011-04-19 MED ORDER — METFORMIN HCL 500 MG PO TABS
1000.0000 mg | ORAL_TABLET | Freq: Two times a day (BID) | ORAL | Status: DC
  Administered 2011-04-19 – 2011-04-21 (×4): 1000 mg via ORAL
  Filled 2011-04-19 (×4): qty 2

## 2011-04-19 MED ORDER — ROCURONIUM BROMIDE 50 MG/5ML IV SOLN
INTRAVENOUS | Status: AC
  Filled 2011-04-19: qty 1

## 2011-04-19 MED ORDER — ONDANSETRON HCL 4 MG/2ML IJ SOLN: 4.0000 mg | Freq: Four times a day (QID) | INTRAMUSCULAR | Status: DC | PRN

## 2011-04-19 MED ORDER — CARVEDILOL 12.5 MG PO TABS
6.2500 mg | ORAL_TABLET | Freq: Every day | ORAL | Status: DC
  Administered 2011-04-19 – 2011-04-20 (×2): 6.25 mg via ORAL
  Filled 2011-04-19 (×2): qty 1

## 2011-04-19 MED ORDER — OXYCODONE-ACETAMINOPHEN 5-325 MG PO TABS: 1.0000 | ORAL_TABLET | ORAL | Status: DC | PRN

## 2011-04-19 MED ORDER — LACTATED RINGERS IV SOLN
INTRAVENOUS | Status: DC | PRN
  Administered 2011-04-19 (×3): via INTRAVENOUS

## 2011-04-19 MED ORDER — KETOROLAC TROMETHAMINE 30 MG/ML IJ SOLN: 30.0000 mg | Freq: Four times a day (QID) | INTRAMUSCULAR | Status: DC

## 2011-04-19 MED ORDER — ONDANSETRON HCL 4 MG/2ML IJ SOLN
4.0000 mg | Freq: Four times a day (QID) | INTRAMUSCULAR | Status: DC | PRN
  Administered 2011-04-20: 4 mg via INTRAVENOUS
  Filled 2011-04-19: qty 2

## 2011-04-19 MED ORDER — FENTANYL CITRATE 0.05 MG/ML IJ SOLN
25.0000 ug | INTRAMUSCULAR | Status: DC | PRN
  Administered 2011-04-19 (×4): 50 ug via INTRAVENOUS

## 2011-04-19 MED ORDER — KETOROLAC TROMETHAMINE 30 MG/ML IJ SOLN
30.0000 mg | Freq: Four times a day (QID) | INTRAMUSCULAR | Status: AC
  Administered 2011-04-19 – 2011-04-20 (×5): 30 mg via INTRAVENOUS
  Filled 2011-04-19 (×4): qty 1

## 2011-04-19 MED ORDER — DIPHENHYDRAMINE HCL 50 MG/ML IJ SOLN
INTRAMUSCULAR | Status: AC
  Filled 2011-04-19: qty 1

## 2011-04-19 MED ORDER — PROPOFOL 10 MG/ML IV EMUL
INTRAVENOUS | Status: DC | PRN
  Administered 2011-04-19: 150 mg via INTRAVENOUS

## 2011-04-19 MED ORDER — SODIUM CHLORIDE 0.9 % IV SOLN
INTRAVENOUS | Status: DC | PRN
  Administered 2011-04-19: 10:00:00 via INTRAVENOUS

## 2011-04-19 MED ORDER — MIDAZOLAM HCL 2 MG/2ML IJ SOLN: 1.0000 mg | INTRAMUSCULAR | Status: DC | PRN

## 2011-04-19 MED ORDER — SIMVASTATIN 20 MG PO TABS
20.0000 mg | ORAL_TABLET | Freq: Every day | ORAL | Status: DC
  Administered 2011-04-19 – 2011-04-20 (×2): 20 mg via ORAL
  Filled 2011-04-19 (×2): qty 1

## 2011-04-19 MED ORDER — SODIUM CHLORIDE 0.9 % IJ SOLN: 9.0000 mL | INTRAMUSCULAR | Status: DC | PRN

## 2011-04-19 MED ORDER — ONDANSETRON HCL 4 MG/2ML IJ SOLN
4.0000 mg | Freq: Once | INTRAMUSCULAR | Status: AC
  Administered 2011-04-19: 4 mg via INTRAVENOUS

## 2011-04-19 MED ORDER — SIMETHICONE 80 MG PO CHEW: 80.0000 mg | CHEWABLE_TABLET | Freq: Four times a day (QID) | ORAL | Status: DC | PRN

## 2011-04-19 MED ORDER — DIPHENHYDRAMINE HCL 50 MG/ML IJ SOLN: 12.5000 mg | Freq: Four times a day (QID) | INTRAMUSCULAR | Status: DC | PRN

## 2011-04-19 MED ORDER — ONDANSETRON HCL 4 MG/2ML IJ SOLN: 4.0000 mg | Freq: Once | INTRAMUSCULAR | Status: DC | PRN

## 2011-04-19 MED ORDER — POTASSIUM CHLORIDE IN NACL 20-0.9 MEQ/L-% IV SOLN
INTRAVENOUS | Status: DC
Start: 2011-04-19 — End: 2011-04-21
  Administered 2011-04-19 – 2011-04-20 (×4): via INTRAVENOUS

## 2011-04-19 MED ORDER — GLYCOPYRROLATE 0.2 MG/ML IJ SOLN: 0.2000 mg | Freq: Once | INTRAMUSCULAR | Status: DC

## 2011-04-19 MED ORDER — MIDAZOLAM HCL 2 MG/2ML IJ SOLN
INTRAMUSCULAR | Status: AC
  Administered 2011-04-19: 2 mg via INTRAVENOUS
  Filled 2011-04-19: qty 2

## 2011-04-19 MED ORDER — LACTATED RINGERS IV SOLN
INTRAVENOUS | Status: DC
  Administered 2011-04-19: 1000 mL via INTRAVENOUS

## 2011-04-19 MED ORDER — CEFAZOLIN SODIUM 1-5 GM-% IV SOLN
INTRAVENOUS | Status: AC
  Filled 2011-04-19: qty 100

## 2011-04-19 MED ORDER — SUFENTANIL CITRATE 50 MCG/ML IV SOLN
INTRAVENOUS | Status: DC | PRN
  Administered 2011-04-19: 10 ug via INTRAVENOUS
  Administered 2011-04-19: 20 ug via INTRAVENOUS
  Administered 2011-04-19 (×3): 10 ug via INTRAVENOUS
  Administered 2011-04-19: 20 ug via INTRAVENOUS
  Administered 2011-04-19 (×2): 10 ug via INTRAVENOUS

## 2011-04-19 MED ORDER — NALOXONE HCL 0.4 MG/ML IJ SOLN: 0.4000 mg | INTRAMUSCULAR | Status: DC | PRN

## 2011-04-19 MED ORDER — LIDOCAINE HCL (CARDIAC) 10 MG/ML IV SOLN
INTRAVENOUS | Status: DC | PRN
  Administered 2011-04-19: 50 mg via INTRAVENOUS

## 2011-04-19 MED ORDER — FENTANYL CITRATE 0.05 MG/ML IJ SOLN: 25.0000 ug | INTRAMUSCULAR | Status: DC | PRN

## 2011-04-19 MED ORDER — FENTANYL CITRATE 0.05 MG/ML IJ SOLN
INTRAMUSCULAR | Status: AC
  Administered 2011-04-19: 50 ug via INTRAVENOUS
  Filled 2011-04-19: qty 2

## 2011-04-19 MED ORDER — PROPOFOL 10 MG/ML IV EMUL
INTRAVENOUS | Status: AC
  Filled 2011-04-19: qty 20

## 2011-04-19 MED ORDER — CEFAZOLIN SODIUM 1-5 GM-% IV SOLN
INTRAVENOUS | Status: DC | PRN
  Administered 2011-04-19: 2 g via INTRAVENOUS

## 2011-04-19 MED ORDER — LABETALOL HCL 5 MG/ML IV SOLN
INTRAVENOUS | Status: DC | PRN
  Administered 2011-04-19 (×3): 5 mg via INTRAVENOUS

## 2011-04-19 MED ORDER — SUFENTANIL CITRATE 50 MCG/ML IV SOLN
INTRAVENOUS | Status: AC
  Filled 2011-04-19: qty 1

## 2011-04-19 MED ORDER — DIPHENHYDRAMINE HCL 12.5 MG/5ML PO ELIX: 12.5000 mg | ORAL_SOLUTION | Freq: Four times a day (QID) | ORAL | Status: DC | PRN

## 2011-04-19 MED ORDER — PANTOPRAZOLE SODIUM 40 MG IV SOLR: 40.0000 mg | Freq: Every day | INTRAVENOUS | Status: DC

## 2011-04-19 MED ORDER — GLYCOPYRROLATE 0.2 MG/ML IJ SOLN
INTRAMUSCULAR | Status: DC | PRN
  Administered 2011-04-19: 0.6 mg via INTRAVENOUS

## 2011-04-19 MED ORDER — FENTANYL CITRATE 0.05 MG/ML IJ SOLN
INTRAMUSCULAR | Status: AC
Start: 2011-04-19 — End: 2011-04-19
  Administered 2011-04-19: 50 ug via INTRAVENOUS
  Filled 2011-04-19: qty 2

## 2011-04-19 MED ORDER — ONDANSETRON HCL 4 MG/2ML IJ SOLN
INTRAMUSCULAR | Status: AC
  Administered 2011-04-19: 4 mg via INTRAVENOUS
  Filled 2011-04-19: qty 2

## 2011-04-19 MED ORDER — POTASSIUM CHLORIDE IN NACL 20-0.9 MEQ/L-% IV SOLN: INTRAVENOUS | Status: DC

## 2011-04-19 MED ORDER — LACTATED RINGERS IV SOLN: INTRAVENOUS | Status: DC

## 2011-04-19 MED ORDER — ROCURONIUM BROMIDE 100 MG/10ML IV SOLN
INTRAVENOUS | Status: DC | PRN
  Administered 2011-04-19: 40 mg via INTRAVENOUS
  Administered 2011-04-19: 20 mg via INTRAVENOUS
  Administered 2011-04-19: 10 mg via INTRAVENOUS

## 2011-04-19 MED ORDER — ONDANSETRON HCL 4 MG PO TABS: 4.0000 mg | ORAL_TABLET | Freq: Four times a day (QID) | ORAL | Status: DC | PRN

## 2011-04-19 MED ORDER — ZOLPIDEM TARTRATE 5 MG PO TABS: 5.0000 mg | ORAL_TABLET | Freq: Every evening | ORAL | Status: DC | PRN

## 2011-04-19 MED ORDER — LABETALOL HCL 5 MG/ML IV SOLN
INTRAVENOUS | Status: AC
  Filled 2011-04-19: qty 4

## 2011-04-19 MED ORDER — OXYCODONE-ACETAMINOPHEN 5-325 MG PO TABS
1.0000 | ORAL_TABLET | ORAL | Status: DC | PRN
  Administered 2011-04-19 (×3): 2 via ORAL
  Administered 2011-04-20: 1 via ORAL
  Administered 2011-04-21: 2 via ORAL
  Filled 2011-04-19 (×3): qty 2
  Filled 2011-04-19: qty 1
  Filled 2011-04-19: qty 2

## 2011-04-19 MED ORDER — KETOROLAC TROMETHAMINE 30 MG/ML IJ SOLN
30.0000 mg | Freq: Four times a day (QID) | INTRAMUSCULAR | Status: AC
  Filled 2011-04-19: qty 1

## 2011-04-19 MED ORDER — ONDANSETRON HCL 4 MG/2ML IJ SOLN: 4.0000 mg | Freq: Once | INTRAMUSCULAR | Status: DC

## 2011-04-19 MED ORDER — GLIPIZIDE 5 MG PO TABS
10.0000 mg | ORAL_TABLET | Freq: Two times a day (BID) | ORAL | Status: DC
  Administered 2011-04-20 – 2011-04-21 (×3): 10 mg via ORAL
  Filled 2011-04-19 (×3): qty 2

## 2011-04-19 MED ORDER — DOCUSATE SODIUM 100 MG PO CAPS
100.0000 mg | ORAL_CAPSULE | Freq: Two times a day (BID) | ORAL | Status: DC
  Administered 2011-04-20 (×2): 100 mg via ORAL
  Filled 2011-04-19 (×2): qty 1

## 2011-04-19 MED ORDER — MIDAZOLAM HCL 2 MG/2ML IJ SOLN
1.0000 mg | INTRAMUSCULAR | Status: DC | PRN
  Administered 2011-04-19: 2 mg via INTRAVENOUS

## 2011-04-19 MED ORDER — DOCUSATE SODIUM 100 MG PO CAPS: 100.0000 mg | ORAL_CAPSULE | Freq: Two times a day (BID) | ORAL | Status: DC

## 2011-04-19 MED ORDER — NEOSTIGMINE METHYLSULFATE 1 MG/ML IJ SOLN
INTRAMUSCULAR | Status: AC
  Filled 2011-04-19: qty 10

## 2011-04-19 MED ORDER — LIDOCAINE HCL (PF) 1 % IJ SOLN
INTRAMUSCULAR | Status: AC
  Filled 2011-04-19: qty 5

## 2011-04-19 MED ORDER — NEOSTIGMINE METHYLSULFATE 1 MG/ML IJ SOLN
INTRAMUSCULAR | Status: DC | PRN
  Administered 2011-04-19: 4 mg via INTRAVENOUS

## 2011-04-19 MED ORDER — PANTOPRAZOLE SODIUM 40 MG IV SOLR
40.0000 mg | Freq: Every day | INTRAVENOUS | Status: DC
  Administered 2011-04-19 – 2011-04-20 (×2): 40 mg via INTRAVENOUS
  Filled 2011-04-19 (×2): qty 40

## 2011-04-19 MED ORDER — ALBUTEROL SULFATE HFA 108 (90 BASE) MCG/ACT IN AERS
2.0000 | INHALATION_SPRAY | Freq: Four times a day (QID) | RESPIRATORY_TRACT | Status: DC | PRN
Start: 2011-04-19 — End: 2011-04-21

## 2011-04-19 MED ORDER — 0.9 % SODIUM CHLORIDE (POUR BTL) OPTIME
TOPICAL | Status: DC | PRN
  Administered 2011-04-19: 2000 mL

## 2011-04-19 SURGICAL SUPPLY — 90 items
ADH SKN CLS APL DERMABOND .7 (GAUZE/BANDAGES/DRESSINGS)
APL SKNCLS STERI-STRIP NONHPOA (GAUZE/BANDAGES/DRESSINGS) ×1
APPLIER CLIP 11 MED OPEN (CLIP) ×2
APPLIER CLIP 13 LRG OPEN (CLIP)
APR CLP LRG 13 20 CLIP (CLIP)
APR CLP MED 11 20 MLT OPN (CLIP) ×1
BAG HAMPER (MISCELLANEOUS) ×2 IMPLANT
BENZOIN TINCTURE PRP APPL 2/3 (GAUZE/BANDAGES/DRESSINGS) ×1 IMPLANT
BINDER ABD UNIV 9 30-45 (GAUZE/BANDAGES/DRESSINGS) IMPLANT
BINDER ABDOMINAL 9 (GAUZE/BANDAGES/DRESSINGS) ×2
BLADE SURG SZ11 CARB STEEL (BLADE) ×2 IMPLANT
BRR ADH 6X5 SEPRAFILM 1 SHT (MISCELLANEOUS)
CELLS DAT CNTRL 66122 CELL SVR (MISCELLANEOUS) IMPLANT
CLIP APPLIE 11 MED OPEN (CLIP) IMPLANT
CLIP APPLIE 13 LRG OPEN (CLIP) IMPLANT
CLOTH BEACON ORANGE TIMEOUT ST (SAFETY) ×2 IMPLANT
CLSR STERI-STRIP ANTIMIC 1/2X4 (GAUZE/BANDAGES/DRESSINGS) ×1 IMPLANT
COVER SURGICAL LIGHT HANDLE (MISCELLANEOUS) ×4 IMPLANT
DERMABOND ADVANCED (GAUZE/BANDAGES/DRESSINGS)
DERMABOND ADVANCED .7 DNX12 (GAUZE/BANDAGES/DRESSINGS) IMPLANT
DRAPE WARM FLUID 44X44 (DRAPE) ×2 IMPLANT
DRESSING TELFA 8X3 (GAUZE/BANDAGES/DRESSINGS) ×2 IMPLANT
ELECT REM PT RETURN 9FT ADLT (ELECTROSURGICAL) ×2
ELECTRODE REM PT RTRN 9FT ADLT (ELECTROSURGICAL) ×1 IMPLANT
EVACUATOR DRAINAGE 10X20 100CC (DRAIN) IMPLANT
EVACUATOR SILICONE 100CC (DRAIN)
FORMALIN 10 PREFIL 120ML (MISCELLANEOUS) ×1 IMPLANT
FORMALIN 10 PREFIL 480ML (MISCELLANEOUS) ×1 IMPLANT
GLOVE BIOGEL PI IND STRL 7.0 (GLOVE) IMPLANT
GLOVE BIOGEL PI IND STRL 7.5 (GLOVE) IMPLANT
GLOVE BIOGEL PI INDICATOR 7.0 (GLOVE) ×1
GLOVE BIOGEL PI INDICATOR 7.5 (GLOVE) ×1
GLOVE ECLIPSE 6.5 STRL STRAW (GLOVE) ×1 IMPLANT
GLOVE ECLIPSE 7.0 STRL STRAW (GLOVE) ×1 IMPLANT
GLOVE ECLIPSE 9.0 STRL (GLOVE) ×3 IMPLANT
GLOVE EXAM NITRILE MD LF STRL (GLOVE) ×1 IMPLANT
GLOVE INDICATOR STER SZ 9 (GLOVE) ×2 IMPLANT
GOWN STRL REIN 3XL LVL4 (GOWN DISPOSABLE) ×2 IMPLANT
GOWN STRL REIN XL XLG (GOWN DISPOSABLE) ×4 IMPLANT
INST SET MAJOR GENERAL (KITS) ×2 IMPLANT
INST SET MINOR GENERAL (KITS) ×2 IMPLANT
KIT ROOM TURNOVER APOR (KITS) ×2 IMPLANT
MANIFOLD NEPTUNE II (INSTRUMENTS) ×2 IMPLANT
NDL HYPO 25X1 1.5 SAFETY (NEEDLE) ×1 IMPLANT
NEEDLE HYPO 25X1 1.5 SAFETY (NEEDLE) ×2 IMPLANT
NS IRRIG 1000ML POUR BTL (IV SOLUTION) ×4 IMPLANT
PACK ABDOMINAL MAJOR (CUSTOM PROCEDURE TRAY) ×2 IMPLANT
PACK MINOR (CUSTOM PROCEDURE TRAY) ×2 IMPLANT
PAD ARMBOARD 7.5X6 YLW CONV (MISCELLANEOUS) ×2 IMPLANT
RETRACTOR WND ALEXIS 18 MED (MISCELLANEOUS) IMPLANT
RETRACTOR WND ALEXIS 25 LRG (MISCELLANEOUS) IMPLANT
RTRCTR WOUND ALEXIS 18CM MED (MISCELLANEOUS)
RTRCTR WOUND ALEXIS 25CM LRG (MISCELLANEOUS) ×2
SEPRAFILM MEMBRANE 5X6 (MISCELLANEOUS) IMPLANT
SET BASIN LINEN APH (SET/KITS/TRAYS/PACK) ×2 IMPLANT
SOL PREP PROV IODINE SCRUB 4OZ (MISCELLANEOUS) ×2 IMPLANT
SPONGE DRAIN TRACH 4X4 STRL 2S (GAUZE/BANDAGES/DRESSINGS) IMPLANT
SPONGE GAUZE 2X2 8PLY STRL LF (GAUZE/BANDAGES/DRESSINGS) ×2 IMPLANT
SPONGE GAUZE 4X4 12PLY (GAUZE/BANDAGES/DRESSINGS) ×2 IMPLANT
SPONGE LAP 18X18 X RAY DECT (DISPOSABLE) ×2 IMPLANT
STAPLER VISISTAT (STAPLE) ×2 IMPLANT
STAPLER VISISTAT 35W (STAPLE) IMPLANT
STRIP CLOSURE SKIN 1/2X4 (GAUZE/BANDAGES/DRESSINGS) ×4 IMPLANT
SUT CHROMIC 0 CT 1 (SUTURE) ×24 IMPLANT
SUT CHROMIC 2 0 CT 1 (SUTURE) ×4 IMPLANT
SUT CHROMIC GUT BROWN 0 54 (SUTURE) IMPLANT
SUT CHROMIC GUT BROWN 0 54IN (SUTURE)
SUT ETHIBOND NAB MO 7 #0 18IN (SUTURE) ×1 IMPLANT
SUT ETHILON 3 0 FSL (SUTURE) IMPLANT
SUT PDS AB CT VIOLET #0 27IN (SUTURE) IMPLANT
SUT PLAIN CT 1/2CIR 2-0 27IN (SUTURE) ×3 IMPLANT
SUT PROLENE 0 CT 1 30 (SUTURE) ×8 IMPLANT
SUT SILK 3 0 SH CR/8 (SUTURE) ×1 IMPLANT
SUT VIC AB 0 CT1 27 (SUTURE)
SUT VIC AB 0 CT1 27XBRD ANTBC (SUTURE) IMPLANT
SUT VIC AB 2-0 CT1 27 (SUTURE)
SUT VIC AB 2-0 CT1 TAPERPNT 27 (SUTURE) IMPLANT
SUT VIC AB 2-0 CT2 27 (SUTURE) ×1 IMPLANT
SUT VIC AB 3-0 SH 27 (SUTURE)
SUT VIC AB 3-0 SH 27X BRD (SUTURE) ×1 IMPLANT
SUT VIC AB 4-0 PS2 27 (SUTURE) ×1 IMPLANT
SUT VIC AB 5-0 P-3 18X BRD (SUTURE) IMPLANT
SUT VIC AB 5-0 P3 18 (SUTURE)
SUT VICRYL 4 0 KS 27 (SUTURE) ×2 IMPLANT
SUT VICRYL AB 2 0 TIES (SUTURE) ×2 IMPLANT
SUT VICRYL AB 3 0 TIES (SUTURE) IMPLANT
SYR CONTROL 10ML LL (SYRINGE) ×2 IMPLANT
TAPE CLOTH SURG 4X10 WHT LF (GAUZE/BANDAGES/DRESSINGS) ×1 IMPLANT
TOWEL BLUE STERILE X RAY DET (MISCELLANEOUS) ×2 IMPLANT
TRAY FOLEY CATH 14FR (SET/KITS/TRAYS/PACK) ×2 IMPLANT

## 2011-04-19 NOTE — Anesthesia Postprocedure Evaluation (Addendum)
  Anesthesia Post-op Note  Patient: [name]  Procedure(s) Performed: Procedure(s) (LRB): HYSTERECTOMY ABDOMINAL (N/A) HERNIA REPAIR UMBILICAL ADULT (N/A)  Patient Location: PACU  Anesthesia Type: General  Level of Consciousness: awake, alert , oriented and patient cooperative  Airway and Oxygen Therapy: Patient Spontanous Breathing and Patient connected to face mask oxygen  Post-op Pain: mild  Post-op Assessment: Post-op Vital signs reviewed, Patient's Cardiovascular Status Stable, Respiratory Function Stable, Patent Airway and No signs of Nausea or vomiting  Post-op Vital Signs: Reviewed and stable  Complications: No apparent anesthesia complications  04/20/11  Patient doing well.  VSS.  No apparent anesthesia complications.

## 2011-04-19 NOTE — Transfer of Care (Signed)
Immediate Anesthesia Transfer of Care Note  Patient: [name]  Procedure(s) Performed: Procedure(s) (LRB): HYSTERECTOMY ABDOMINAL (N/A) HERNIA REPAIR UMBILICAL ADULT (N/A)  Patient Location: PACU  Anesthesia Type: General  Level of Consciousness: awake, alert , oriented and patient cooperative  Airway & Oxygen Therapy: Patient Spontanous Breathing and Patient connected to face mask oxygen  Post-op Assessment: Report given to PACU RN and Post -op Vital signs reviewed and stable  Post vital signs: Reviewed and stable  Complications: No apparent anesthesia complications

## 2011-04-19 NOTE — Brief Op Note (Signed)
Pt took citrate of magnesia 4-14  Evening of surgery  States had good results

## 2011-04-19 NOTE — Op Note (Addendum)
See operative details dictated as a part of the brief op note

## 2011-04-19 NOTE — H&P (Signed)
[name] is an 51 y.o. female. She is admitted for abdominal hysterectomy with preservation of tubes and ovaries for unacceptably heavy bleeding which has resumed after endometrial ablation performed 2009.. She's been followed at [facility] over the last 8 months and medical therapies have not resulted in an acceptable control of the intermittent irregular bleeding. Endometrial biopsy in the past showed benign secretory endometrium. Efforts at repeat biopsy during preop or unsuccessful due  suspected stenosis of the lower uterine segment. Pros and cons of ovarian preservation of been discussed with patient and plans are for preservation of both tubes and ovaries. Pap smears have remained normal and cervical removal is planned.  Additionally she is a small umbilical hernia which desires repaired.  Pertinent Gynecological History: Menses: irregular occurring approximately every 60 days without intermenstrual spotting periods are accompanied by significant cramping Bleeding: Last period included cramping and clots Contraception: tubal ligation DES exposure: unknown Blood transfusions: none Sexually transmitted diseases: none known Previous GYN Procedures: Cesarean section x2, tubal ligation, endometrial ablation  Last mammogram: normal Date:  Last pap: normal Date: 3 2012 OB History: G 2, P 2   Menstrual History: Menarche age:  Patient's last menstrual period was 03/26/2011.    Past Medical History  Diagnosis Date  . Hypertension   . Asthma 1998  . Diabetes mellitus 2005  . Sleep apnea   . COPD (chronic obstructive pulmonary disease)    Patient discontinued Plavix 10 days ago as well as discontinued aspirin Past Surgical History  Procedure Date  . Cardiac stents     Two-2003;Two-2006  . Cesarean section [id number]  . Dilation and curettage of uterus   . Tubal ligation 1990  . Cardiac catheterization 2003;2006    Family History  Problem Relation Age of Onset  .  Heart attack Mother   . Kidney failure Father     Social History:  reports that she has never smoked. She does not have any smokeless tobacco history on file. She reports that she does not drink alcohol or use illicit drugs.  Allergies:  Allergies  Allergen Reactions  . Azithromycin Nausea And Vomiting    Prescriptions prior to admission  Medication Sig Dispense Refill  . albuterol (PROVENTIL HFA;VENTOLIN HFA) 108 (90 BASE) MCG/ACT inhaler Inhale 2 puffs into the lungs every 6 (six) hours as needed. For shortness of breath      . carvedilol (COREG) 6.25 MG tablet Take 6.25 mg by mouth daily.      . Cyanocobalamin (VITAMIN B 12 PO) Take 1 tablet by mouth daily.      . ferrous sulfate 325 (65 FE) MG tablet Take 325 mg by mouth daily with breakfast.      . glipiZIDE (GLUCOTROL) 10 MG tablet Take 10 mg by mouth 2 (two) times daily before a meal.      . metFORMIN (GLUCOPHAGE) 1000 MG tablet Take 1,000 mg by mouth 2 (two) times daily with a meal.      . Multiple Vitamins-Minerals (ONE-A-DAY WOMENS VITACRAVES) CHEW Chew 1 tablet by mouth daily.      . pravastatin (PRAVACHOL) 20 MG tablet Take 20 mg by mouth daily.      Marland Kitchen aspirin 325 MG tablet Take 325 mg by mouth daily.      . clopidogrel (PLAVIX) 75 MG tablet Take 75 mg by mouth daily.        ROS  Blood pressure 133/77, pulse 72, temperature 97.8 F (36.6 C), temperature source Oral, resp. rate 18, last  menstrual period 03/26/2011, SpO2 98.00%. Physical Exam  Constitutional: She is oriented to person, place, and time. She appears well-developed and well-nourished.  HENT:  Head: Normocephalic and atraumatic.  Eyes: Pupils are equal, round, and reactive to light.  Neck: No thyromegaly present.  Cardiovascular: Normal rate and regular rhythm.   Respiratory: Effort normal and breath sounds normal.  GI: Soft. Bowel sounds are normal.       Lower abdominal midline vertical scar from 2 prior cesarean sections.  Incision extends nearly to  umbilicus, deviates to left.  Umbilicus has a small reducible 2 cm hernia, which is sensitive to direct palpation.  Patient desires repair of this as part of case  Genitourinary: Vagina normal.       Normal external genitalia.  Ultrasound of pelvis:  Uterus size si 10.4 x 6.6 x 6.6 cm fith small calcified fibroid in uterine wall. 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  Review of prior endometrial ablation tissue sampling shows benign endometrium only.  Neurological: She is alert and oriented to person, place, and time.  Psychiatric: She has a normal mood and affect. Her behavior is normal. Judgment and thought content normal.    Results for orders placed during the hospital encounter of 04/19/11 (from the past 24 hour(s))  PREPARE RBC (CROSSMATCH)     Status: Normal   Collection Time   04/19/11  6:10 AM      Component Value Range   Order Confirmation ORDER PROCESSED BY BLOOD BANK    GLUCOSE, CAPILLARY     Status: Abnormal   Collection Time   04/19/11  6:21 AM      Component Value Range   Glucose-Capillary 141 (*) 70 - 99 (mg/dL)    No results found.  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 Risks of procedure reviewed including bleeding infection, need for transfusion, infection, and injury to adjacent organs or change and overall medical status  Kreston Ahrendt V 04/19/2011, 7:08 AM

## 2011-04-19 NOTE — Brief Op Note (Addendum)
04/19/2011  10:43 AM  PATIENT:  [name]  51 y.o. female  PRE-OPERATIVE DIAGNOSIS:  uterine fibroids dysfunctional uterine bleeding umbilical hernia  POST-OPERATIVE DIAGNOSIS:  uterine fibroids dysfunctional uterine bleeding umbilical hernia  PROCEDURE:  Procedure(s) (LRB): HYSTERECTOMY ABDOMINAL (N/A) with frozen section HERNIA REPAIR UMBILICAL ADULT (N/A)  SURGEON:  Surgeon(s) and Role:    * Tilda Burrow, MD - Primary  PHYSICIAN ASSISTANT:   ASSISTANTSAnnabell Howells, RN FA   ANESTHESIA:   general  EBL:  Total I/O In: 3900 [I.V.:3200; Blood:700] Out: 600 [Urine:100; Blood:500]  BLOOD ADMINISTERED:2 unit CC PRBC  DRAINS: Urinary Catheter (Foley)   LOCAL MEDICATIONS USED:  NONE  SPECIMEN:  Source of Specimen:  Uterus and cervix  DISPOSITION OF SPECIMEN:  PATHOLOGY  COUNTS:  YES  TOURNIQUET:  * No tourniquets in log *  DICTATION: .Dragon Dictation She was taken to the operating room prepped and draped for bilateral surgery with vaginal preparation Foley catheter inserted. Timeout was conducted and surgical procedure confirmed by involved surgical team. The previous lower abdominal midline vertical incision was repeated excising the site scar and about 5 cm of skin and fatty tissue from either side to allow for improved access. Fascia was identified and opened sharply with knife dissection, and the omental adhesions to the anterior abdominal wall encountered extensively, taken down carefully and sharply., Using scissors and time and Bovie cautery as access allowed. There was a propensity towards bleeding from cut surfaces. There was a very superficial arc to  the adjacent small bowel that was carefully inspected confirmed as superficial and less than 2 mm in diameter. This was oversewed with 2-0 silk. It was inspected carefully and was completely covered by the oversewing. The uterus was found to be densely adherent to the anterior abdominal wall in the area of the  bladder, and there was a firm dense 1 cm diameter lesion from the uterine fundus to the anterior abdominal wall to the left of the midline. This was taken down sharply after clamping transecting and 0 chromic suture ligature. Bladder flap could be carefully dissected free using primarily blunt dissection. There was again tendencies to reduce from the surface which which was carefully inspected and very superficial point cautery used as necessary. Omentum was also adherent to the back of the uterus and required careful dissection to peel it off of the back of the uterus. These tips of the omentum were inspected and several required suture ligature to acquire adequate hemostasis. Next Round ligaments were clamped cut and suture ligated on the side and the utero-ovarian ligaments isolated doubly clamped cut and suture ligated with 0 chromic. The left uterine vessels were doubly clamped with curved Heaney clamps as well as a Kelly clamp for back bleeding doubly ligated below and the backbleeding controlled with a suture ligature as well. Opposite side was treated similarly. Wide malleable was placed in the high in the uterus along with multiple laparotomy tapes as needed throughout the case. At this time the uterus could be amputated off the lower uterine segment and the uterus opened. There was a generous amount of endometrial tissue, so the specimen was sent for frozen section. Frozen section returned suggesting hormonal effect is consistent with the Megace the patient was taking prior to surgery.  Lower uterine segment was carefully taken down clamped cut and suture ligated with straight Heaney clamps knife dissection and 0 chromic suture ligature. The cervix was then taken off the cuff, and the cuff closed with Aldridge stitches at  each lateral vaginal angle and the running locking 0 chromic across the cuff in a transverse fashion Allis was irrigated inspected and confirmed as adequately hemostatic. Laparotomy  equipment was removed. Frozen section report was returned at this time. After careful inspection of all surfaces and adequate irrigation, the abdomen was closed by running 0 Prolene started at each end of the incision sewn to the middle of the incision. Subcutaneous tissues were reapproximated using interrupted loose 20 plain, 2 layers, then the subcuticular skin closure with 4-0 Vicryl completed the procedure. Sponge and needle counts were correct patient to recovery in stable condition  PLAN OF CARE: Admit to inpatient   PATIENT DISPOSITION:  PACU - hemodynamically stable.   Delay start of Pharmacological VTE agent (>24hrs) due to surgical blood loss or risk of bleeding: yes

## 2011-04-19 NOTE — Preoperative (Signed)
Beta Blockers   Reason not to administer Beta Blockers:Not Applicable 

## 2011-04-19 NOTE — Anesthesia Procedure Notes (Signed)
Procedure Name: Intubation Date/Time: 04/19/2011 7:53 AM Performed by: Carolyne Littles, Clenton Esper L Pre-anesthesia Checklist: Patient identified, Patient being monitored, Timeout performed, Emergency Drugs available and Suction available Patient Re-evaluated:Patient Re-evaluated prior to inductionOxygen Delivery Method: Circle System Utilized Preoxygenation: Pre-oxygenation with 100% oxygen Intubation Type: IV induction Ventilation: Mask ventilation without difficulty Laryngoscope Size: 3 and Miller Grade View: Grade I Tube type: Oral Tube size: 7.0 mm Number of attempts: 1 Airway Equipment and Method: stylet Placement Confirmation: ETT inserted through vocal cords under direct vision,  positive ETCO2 and breath sounds checked- equal and bilateral Secured at: 21 cm Tube secured with: Tape Dental Injury: Teeth and Oropharynx as per pre-operative assessment

## 2011-04-19 NOTE — Anesthesia Preprocedure Evaluation (Signed)
Anesthesia Evaluation  Patient identified by MRN, date of birth, ID band Patient awake    Reviewed: Allergy & Precautions, H&P , NPO status , Patient's Chart, lab work & pertinent test results  History of Anesthesia Complications Negative for: history of anesthetic complications  Airway Mallampati: II      Dental  (+) Edentulous Upper   Pulmonary asthma , sleep apnea , COPD breath sounds clear to auscultation        Cardiovascular hypertension, Pt. on medications + CAD and + Cardiac Stents Rhythm:Regular Rate:Normal     Neuro/Psych    GI/Hepatic   Endo/Other  Diabetes mellitus-, Well Controlled, Type 2, Oral Hypoglycemic Agents  Renal/GU      Musculoskeletal   Abdominal   Peds  Hematology   Anesthesia Other Findings   Reproductive/Obstetrics                           Anesthesia Physical Anesthesia Plan  ASA: III  Anesthesia Plan: General   Post-op Pain Management:    Induction: Intravenous  Airway Management Planned: Oral ETT  Additional Equipment:   Intra-op Plan:   Post-operative Plan: Extubation in OR  Informed Consent: I have reviewed the patients History and Physical, chart, labs and discussed the procedure including the risks, benefits and alternatives for the proposed anesthesia with the patient or authorized representative who has indicated his/her understanding and acceptance.     Plan Discussed with:   Anesthesia Plan Comments: (Plavix & ASA stopped for 10 days. )        Anesthesia Quick Evaluation

## 2011-04-20 LAB — GLUCOSE, CAPILLARY
Glucose-Capillary: 195 mg/dL — ABNORMAL HIGH (ref 70–99)
Glucose-Capillary: 158 mg/dL — ABNORMAL HIGH (ref 70–99)
Glucose-Capillary: 126 mg/dL — ABNORMAL HIGH (ref 70–99)
Glucose-Capillary: 104 mg/dL — ABNORMAL HIGH (ref 70–99)

## 2011-04-20 LAB — CBC
Hemoglobin: 11.1 g/dL — ABNORMAL LOW (ref 12.0–15.0)
MCH: 24.9 pg — ABNORMAL LOW (ref 26.0–34.0)
MCV: 76.2 fL — ABNORMAL LOW (ref 78.0–100.0)
RBC: 4.46 MIL/uL (ref 3.87–5.11)

## 2011-04-20 LAB — BASIC METABOLIC PANEL
Chloride: 104 mEq/L (ref 96–112)
GFR calc Af Amer: >90 mL/min (ref >90)
GFR calc non Af Amer: >90 mL/min (ref >90)
Potassium: 4.5 mEq/L (ref 3.5–5.1)
Sodium: 134 mEq/L — ABNORMAL LOW (ref 135–145)

## 2011-04-20 MED ORDER — SODIUM CHLORIDE 0.9 % IJ SOLN
INTRAMUSCULAR | Status: AC
  Filled 2011-04-20: qty 3

## 2011-04-20 MED ORDER — CLOPIDOGREL BISULFATE 75 MG PO TABS
75.0000 mg | ORAL_TABLET | Freq: Every day | ORAL | Status: DC
  Administered 2011-04-20 – 2011-04-21 (×2): 75 mg via ORAL
  Filled 2011-04-20 (×2): qty 1

## 2011-04-20 NOTE — Progress Notes (Signed)
1 Day Post-Op Procedure(s) (LRB): HYSTERECTOMY ABDOMINAL (N/A) HERNIA REPAIR UMBILICAL ADULT (N/A)  Subjective: Patient reports incisional pain and tolerating PO.  Some nausea, mild decreased hunger  Objective: I have reviewed patient's vital signs, intake and output and labs.  General: alert and cooperative GI: soft, non-tender; bowel sounds normal; no masses,  no organomegaly and incision: clean and dry Extremities: Homans sign is negative, no sign of DVT  Assessment: s/p Procedure(s) (LRB): HYSTERECTOMY ABDOMINAL (N/A) HERNIA REPAIR UMBILICAL ADULT (N/A): stable, progressing well and not tolerating diet  Plan: d/c foley, reduce fluids  LOS: 1 day    [name] V 04/20/2011, 6:26 PM

## 2011-04-20 NOTE — Progress Notes (Signed)
UR Chart Review Completed  

## 2011-04-20 NOTE — Addendum Note (Signed)
Addendum  created 04/20/11 1251 by Moshe Salisbury, CRNA   Modules edited:Notes Section

## 2011-04-21 ENCOUNTER — Encounter (HOSPITAL_COMMUNITY): Payer: Self-pay | Admitting: Obstetrics and Gynecology

## 2011-04-21 DIAGNOSIS — N8003 Adenomyosis of the uterus: Secondary | ICD-10-CM | POA: Diagnosis present

## 2011-04-21 DIAGNOSIS — N800 Endometriosis of uterus: Secondary | ICD-10-CM | POA: Diagnosis present

## 2011-04-21 MED ORDER — OXYCODONE-ACETAMINOPHEN 5-325 MG PO TABS: 1.0000 | ORAL_TABLET | ORAL | Status: AC | PRN

## 2011-04-21 MED ORDER — OXYCODONE-ACETAMINOPHEN 5-325 MG PO TABS: 1.0000 | ORAL_TABLET | ORAL | Status: DC | PRN

## 2011-04-21 MED ORDER — DSS 100 MG PO CAPS: 100.0000 mg | ORAL_CAPSULE | Freq: Two times a day (BID) | ORAL | Status: AC

## 2011-04-21 NOTE — Progress Notes (Signed)
2 Days Post-Op Procedure(s) (LRB): HYSTERECTOMY ABDOMINAL (N/A) HERNIA REPAIR UMBILICAL ADULT (N/A)  Subjective: Patient reports incisional pain, tolerating PO, + flatus and no problems voiding.  Denies vomiting, or nausea, mild anorexia  Objective:   have reviewed patient's vital signs, medications and pathology.  General: alert, cooperative, appears stated age, no distress and abdomen feels 'heavy' Resp: clear to auscultation bilaterally GI: soft, non-tender; bowel sounds normal; no masses,  no organomegaly and incision: clean, dry and intact  Assessment: s/p Procedure(s) (LRB): HYSTERECTOMY ABDOMINAL (N/A) HERNIA REPAIR UMBILICAL ADULT (N/A): stable and progressing well  Plan: Discharge home  LOS: 2 days    [name] V 04/21/2011, 7:16 AM

## 2011-04-21 NOTE — Progress Notes (Signed)
Patient left via personal vehicle in stable condition. F/u appointment made. No questions or concerns at this time. Taken out of facility via wheelchair and staff.

## 2011-04-21 NOTE — Discharge Summary (Signed)
Physician Discharge Summary  Patient ID: [name] MRN: [id number] DOB/AGE: 50-Mar-1962 51 y.o.  Admit date: 04/19/2011 Discharge date: 04/21/2011  Admission Diagnoses: Uterine fibroids 2 history of coronary artery disease 3 pelvic pain and dysmenorrhea 4 umbilical hernia  Discharge Diagnoses: 1 adenomyosis severe  2 history of coronary artery disease  3 pelvic pain and dysmenorrhea  #4 umbilical hernia Active Problems:  * No active hospital problems. *    Discharged Condition: good  Hospital Course: This 51 year old African American female was admitted for abdominal hysterectomy and umbilical hernia repair. This was performed a midline lower abdominal incision. The old cicatrix was removed. The hernia at the umbilicus consisted of both natural umbilical hernia and a postsurgical hernia status post prior cholecystectomy or laparoscopic procedure using the periumbilical site surgery was technically challenging due to deep pelvis, patient obesity and body habitus surgical outcome was good postoperative hemoglobin hemoglobin remained acceptable with the patient receiving 2 units packed cells Intra-Op. Postop hemoglobin was 11, higher than preop values. Patient remained remained afebrile, tolerating regular diet and was sufficiently improved to go home on postop day 2. Pain levels quoted as a 3 bowel function had returned and patient was doing well with clean dry incision. She had resumed Plavix shortly postop day 1 and remained active and ambulatory.    Consults: None  Significant Diagnostic Studies: labs:  CBC    Component Value Date/Time   WBC 6.9 04/20/2011 0500   RBC 4.46 04/20/2011 0500   HGB 11.1* 04/20/2011 0500   HCT 34.0* 04/20/2011 0500   PLT 293 04/20/2011 0500   MCV 76.2* 04/20/2011 0500   MCH 24.9* 04/20/2011 0500   MCHC 32.6 04/20/2011 0500   RDW 18.9* 04/20/2011 0500   pathology   Treatments: surgery: Abdominal hysterectomy with removal of cervix, wide excision of  cicatrix, and umbilical herniorrhaphy  Discharge Exam: Blood pressure 115/74, pulse 68, temperature 98.2 F (36.8 C), temperature source Oral, resp. rate 20, height 4\' 11"  (1.499 m), weight 95 kg (209 lb 7 oz), last menstrual period 03/26/2011, SpO2 98.00%. General appearance: alert, appears stated age, fatigued and moderately obese Resp: clear to auscultation bilaterally Chest wall: no tenderness GI: soft, non-tender; bowel sounds normal; no masses,  no organomegaly and Incision intact clean and dry without bruising or erythema Extremities: Homans sign is negative, no sign of DVT  Disposition: 01-Home or Self Care  Discharge Orders    Future Orders Please Complete By Expires   Diet - low sodium heart healthy      Increase activity slowly      Discharge instructions      Comments:   General Gynecological Post-Operative Instructions You may expect to feel dizzy, weak, and drowsy for as long as 24 hours after receiving the medicine that made you sleep (anesthetic). The following information pertains to your recovery period for the first 24 hours following surgery.  Do not drive a car, ride a bicycle, participate in physical activities, or take public transportation until you are done taking narcotic pain medicines or as directed by your caregiver.  Do not drink alcohol or take tranquilizers.  Do not take medicine that has not been prescribed by your caregiver.  Do not sign important papers or make important decisions while on narcotic pain medicines.  Have a responsible person with you.  CARE OF INCISION  Keep incision clean and dry. Take showers instead of baths for 2 weeks.  Avoid heavy lifting (more than 10 pounds/4.5 kilograms), pushing, or pulling. Avoid  constipation by using stool softener daily Avoid activities that may risk injury to your surgical site.  Only take over-the-counter or prescription medicines for pain, discomfort, or fever as directed by your caregiver. Do not take  aspirin. It can make you bleed. Take medicines (antibiotics) that kill germs as directed.  Call the office or go to the MAU if:  You feel sick to your stomach (nauseous).  You start to throw up (vomit).  You have trouble eating or drinking.  You have an oral temperature above 100.4.  You have constipation that is not helped by adjusting diet or increasing fluid intake. Pain medicines are a common cause of constipation.  SEEK IMMEDIATE MEDICAL CARE IF:  You have persistent dizziness.  You have difficulty breathing or a congested sounding (croupy) cough.  You have an oral temperature above 102.5, not controlled by medicine.  There is increasing pain or tenderness near or in the surgical site.  ExitCare Patient Information 2011 [place], [place].   Driving Restrictions      Comments:   The driving x2 weeks   Lifting restrictions      Comments:   Avoid lifting anything over 20 pounds, or anything that requires her to watch her breath in order to lift it x 4 weeks.   Sexual Activity Restrictions      Comments:   None x6 weeks until released from postop care   No dressing needed      Call MD for:  temperature >100.4      Call MD for:  persistant nausea and vomiting      Call MD for:  severe uncontrolled pain        Medication List  As of 04/21/2011  8:01 AM   TAKE these medications         albuterol 108 (90 BASE) MCG/ACT inhaler   Commonly known as: PROVENTIL HFA;VENTOLIN HFA   Inhale 2 puffs into the lungs every 6 (six) hours as needed. For shortness of breath      aspirin 325 MG tablet   Take 325 mg by mouth daily.      carvedilol 6.25 MG tablet   Commonly known as: COREG   Take 6.25 mg by mouth daily.      clopidogrel 75 MG tablet   Commonly known as: PLAVIX   Take 75 mg by mouth daily.      DSS 100 MG Caps   Take 100 mg by mouth 2 (two) times daily.      ferrous sulfate 325 (65 FE) MG tablet   Take 325 mg by mouth daily with breakfast.      glipiZIDE 10 MG tablet    Commonly known as: GLUCOTROL   Take 10 mg by mouth 2 (two) times daily before a meal.      metFORMIN 1000 MG tablet   Commonly known as: GLUCOPHAGE   Take 1,000 mg by mouth 2 (two) times daily with a meal.      ONE-A-DAY WOMENS VITACRAVES Chew   Chew 1 tablet by mouth daily.      oxyCODONE-acetaminophen 5-325 MG per tablet   Commonly known as: PERCOCET   Take 1-2 tablets by mouth every 3 (three) hours as needed (moderate to severe pain (when tolerating fluids)).      pravastatin 20 MG tablet   Commonly known as: PRAVACHOL   Take 20 mg by mouth daily.      VITAMIN B 12 PO   Take 1 tablet by mouth daily.  Follow-up Information    Follow up with Tilda Burrow, MD. Call in 1 week. (Or earlier as needed if symptoms worsen he may call the office number [phone] 24 hours a day and speak to an on-call nurse)    Contact information:   [facility] [address] [place] [address] [phone]          Signed: Tilda Burrow 04/21/2011, 8:01 AM

## 2011-04-22 NOTE — Progress Notes (Signed)
Discharge summary sent to payer through [facility]  

## 2011-04-24 LAB — TYPE AND SCREEN
ABO/RH(D): B POS
Antibody Screen: NEGATIVE
Unit division: 0

## 2011-08-12 ENCOUNTER — Telehealth: Payer: Self-pay | Admitting: Gastroenterology

## 2011-08-12 NOTE — Telephone Encounter (Signed)
Pt wants an early appt and waiting on the September schedule.

## 2011-08-12 NOTE — Telephone Encounter (Signed)
Pt was calling for nurse. She would not give any other information other than she needed to speak with DS. I told her that I would have DS call her back. [phone] or [phone]

## 2011-08-16 ENCOUNTER — Other Ambulatory Visit: Payer: Self-pay

## 2011-08-16 DIAGNOSIS — Z139 Encounter for screening, unspecified: Secondary | ICD-10-CM

## 2011-08-16 NOTE — Telephone Encounter (Signed)
Called pt. She is driving and she will call me when she gets home.

## 2011-08-16 NOTE — Telephone Encounter (Signed)
Gastroenterology Pre-Procedure Form    Request Date: 08/16/2011    Requesting Physician: Dr. Doreen Salvage at [facility]     PATIENT INFORMATION:  [name] is a 51 y.o., female (DOB=[birth year]).  PROCEDURE: Procedure(s) requested: colonoscopy Procedure Reason: screening for colon cancer  PATIENT REVIEW QUESTIONS: The patient reports the following:   1. Diabetes Melitis: yes  2. Joint replacements in the past 12 months: no 3. Major health problems in the past 3 months: no 4. Has an artificial valve or MVP:no 5. Has been advised in past to take antibiotics in advance of a procedure like teeth cleaning: no}    MEDICATIONS & ALLERGIES:    Patient reports the following regarding taking any blood thinners:   Plavix? yes  Aspirin?no Coumadin?  no  Patient confirms/reports the following medications:  Current Outpatient Prescriptions  Medication Sig Dispense Refill  . albuterol (PROVENTIL HFA;VENTOLIN HFA) 108 (90 BASE) MCG/ACT inhaler Inhale 2 puffs into the lungs every 6 (six) hours as needed. For shortness of breath      . clopidogrel (PLAVIX) 75 MG tablet Take 75 mg by mouth daily.      . Cyanocobalamin (VITAMIN B 12 PO) Take 1 tablet by mouth daily.      Marland Kitchen glipiZIDE (GLUCOTROL) 10 MG tablet Take 10 mg by mouth 2 (two) times daily before a meal.      . metFORMIN (GLUCOPHAGE) 1000 MG tablet Take 1,000 mg by mouth 2 (two) times daily with a meal.      . NON FORMULARY Cinnamon 1000 mg    Takes 2 tablets twice daily      . pravastatin (PRAVACHOL) 20 MG tablet Take 20 mg by mouth daily.      Marland Kitchen aspirin 325 MG tablet Take 325 mg by mouth daily.      . carvedilol (COREG) 6.25 MG tablet Take 6.25 mg by mouth daily.      . ferrous sulfate 325 (65 FE) MG tablet Take 325 mg by mouth daily with breakfast.      . Multiple Vitamins-Minerals (ONE-A-DAY WOMENS VITACRAVES) CHEW Chew 1 tablet by mouth daily.        Patient confirms/reports the following allergies:  Allergies    Allergen Reactions  . Azithromycin Nausea And Vomiting    Patient is appropriate to schedule for requested procedure(s): yes  AUTHORIZATION INFORMATION Primary Insurance:   ID #:  Group #:  Pre-Cert / Auth required:  Pre-Cert / Auth #:   Secondary Insurance:   ID #:   Group #:  Pre-Cert / Auth required Pre-Cert / Auth #:   No orders of the defined types were placed in this encounter.    SCHEDULE INFORMATION: Procedure has been scheduled as follows:  Date: 09/20/2011        Time: 8:30 AM  Location: [facility] Short Stay  This Gastroenterology Pre-Precedure Form is being routed to the following provider(s) for review: R. Roetta Sessions, MD

## 2011-08-17 NOTE — Telephone Encounter (Signed)
OK to schedule. Day of prep: glipizide 5mg  bid. Metformin 500mg  bid.  Hold iron for seven days.

## 2011-08-18 ENCOUNTER — Telehealth: Payer: Self-pay

## 2011-08-18 DIAGNOSIS — Z139 Encounter for screening, unspecified: Secondary | ICD-10-CM

## 2011-08-18 MED ORDER — PEG 3350-KCL-NA BICARB-NACL 420 G PO SOLR: 4000.0000 L | ORAL | Status: AC

## 2011-08-18 NOTE — Telephone Encounter (Signed)
Rx sent to [facility] in [place] and instructions mailed to pt.

## 2011-08-18 NOTE — Telephone Encounter (Signed)
Pt's prep Rx was sent to the pharmacy and her instructions for the prep were mailed to her.

## 2011-09-07 ENCOUNTER — Encounter (HOSPITAL_COMMUNITY): Payer: Self-pay | Admitting: Pharmacy Technician

## 2011-09-20 ENCOUNTER — Encounter (HOSPITAL_COMMUNITY): Payer: Self-pay | Admitting: *Deleted

## 2011-09-20 ENCOUNTER — Encounter (HOSPITAL_COMMUNITY)
Admission: RE | Disposition: A | Discharge status: Home / Self Care | Payer: Self-pay | Source: Ambulatory Visit | Attending: Internal Medicine

## 2011-09-20 ENCOUNTER — Ambulatory Visit (HOSPITAL_COMMUNITY)
Admission: RE | Admit: 2011-09-20 | Discharge: 2011-09-20 | Disposition: A | Discharge status: Home / Self Care | Payer: PRIVATE HEALTH INSURANCE | Source: Ambulatory Visit | Attending: Internal Medicine | Admitting: Internal Medicine

## 2011-09-20 DIAGNOSIS — D128 Benign neoplasm of rectum: Secondary | ICD-10-CM

## 2011-09-20 DIAGNOSIS — I10 Essential (primary) hypertension: Secondary | ICD-10-CM | POA: Insufficient documentation

## 2011-09-20 DIAGNOSIS — J449 Chronic obstructive pulmonary disease, unspecified: Secondary | ICD-10-CM | POA: Insufficient documentation

## 2011-09-20 DIAGNOSIS — D126 Benign neoplasm of colon, unspecified: Secondary | ICD-10-CM

## 2011-09-20 DIAGNOSIS — E119 Type 2 diabetes mellitus without complications: Secondary | ICD-10-CM | POA: Insufficient documentation

## 2011-09-20 DIAGNOSIS — Z01812 Encounter for preprocedural laboratory examination: Secondary | ICD-10-CM | POA: Insufficient documentation

## 2011-09-20 DIAGNOSIS — Z1211 Encounter for screening for malignant neoplasm of colon: Secondary | ICD-10-CM | POA: Insufficient documentation

## 2011-09-20 DIAGNOSIS — J4489 Other specified chronic obstructive pulmonary disease: Secondary | ICD-10-CM | POA: Insufficient documentation

## 2011-09-20 DIAGNOSIS — Z139 Encounter for screening, unspecified: Secondary | ICD-10-CM

## 2011-09-20 DIAGNOSIS — D129 Benign neoplasm of anus and anal canal: Secondary | ICD-10-CM

## 2011-09-20 HISTORY — PX: COLONOSCOPY: SHX5424

## 2011-09-20 SURGERY — COLONOSCOPY
Anesthesia: Moderate Sedation

## 2011-09-20 MED ORDER — MEPERIDINE HCL 100 MG/ML IJ SOLN
INTRAMUSCULAR | Status: AC
  Filled 2011-09-20: qty 2

## 2011-09-20 MED ORDER — MIDAZOLAM HCL 5 MG/5ML IJ SOLN
INTRAMUSCULAR | Status: DC | PRN
  Administered 2011-09-20 (×2): 2 mg via INTRAVENOUS

## 2011-09-20 MED ORDER — MEPERIDINE HCL 100 MG/ML IJ SOLN
INTRAMUSCULAR | Status: DC | PRN
  Administered 2011-09-20: 25 mg via INTRAVENOUS
  Administered 2011-09-20: 50 mg via INTRAVENOUS

## 2011-09-20 MED ORDER — SODIUM CHLORIDE 0.45 % IV SOLN
INTRAVENOUS | Status: DC
  Administered 2011-09-20: 20 mL/h via INTRAVENOUS

## 2011-09-20 MED ORDER — MIDAZOLAM HCL 5 MG/5ML IJ SOLN
INTRAMUSCULAR | Status: AC
  Filled 2011-09-20: qty 10

## 2011-09-20 MED ORDER — STERILE WATER FOR IRRIGATION IR SOLN
Status: DC | PRN
  Administered 2011-09-20: 08:00:00

## 2011-09-20 NOTE — Progress Notes (Signed)
Slow response to verbal and touch stimuli. Opens eyes to name. Oriented to place per nurse. resp adequate/nonlabored. O2 d/c'd. Voices no c/o at this time.

## 2011-09-20 NOTE — Op Note (Signed)
[facility] [address] [place], [address]   COLONOSCOPY PROCEDURE REPORT  PATIENT: [name], [name]  MR#:         [id number] BIRTHDATE: [birth year] , 51  yrs. old GENDER: Female ENDOSCOPIST: R.  Roetta Sessions, MD FACP [facility] REFERRED BY:  Reynolds Bowl, M.D. PROCEDURE DATE:  09/20/2011 PROCEDURE:     Colonoscopy with polyp ablation and biopsy  INDICATIONS: first ever average risk colorectal cancer screening  INFORMED CONSENT:  The risks, benefits, alternatives and imponderables including but not limited to bleeding, perforation as well as the possibility of a missed lesion have been reviewed.  The potential for biopsy, lesion removal, etc. have also been discussed.  Questions have been answered.  All parties agreeable. Please see the history and physical in the medical record for more information.  MEDICATIONS: Versed 4 mg IV and Demerol 75 mg IV in divided doses.  DESCRIPTION OF PROCEDURE:  After a digital rectal exam was performed, the EC-[id number] ([id number])  colonoscope was advanced from the anus through the rectum and colon to the area of the cecum, ileocecal valve and appendiceal orifice.  The cecum was deeply intubated.  These structures were well-seen and photographed for the record.  From the level of the cecum and ileocecal valve, the scope was slowly and cautiously withdrawn.  The mucosal surfaces were carefully surveyed utilizing scope tip deflection to facilitate fold flattening as needed.  The scope was pulled down into the rectum where a thorough examination including retroflexion was performed.    FINDINGS:  Adequate preparation. Normal rectum. Single flat diminutive polyp at the rectosigmoid junction.  There was a 4 mm polyp in the base the cecum. Otherwise, the remainder of colonic mucosa appeared normal.  THERAPEUTIC / DIAGNOSTIC MANEUVERS PERFORMED:  The flat polyp at the rectosigmoid was ablated with hot snare loop. The bile polyp in  the base the cecum was cold biopsied/removed  COMPLICATIONS: none  CECAL WITHDRAWAL TIME:  11 minutes  IMPRESSION:  colonic polyps(2) 1 ablated and 1 Biopsy/ removed as described above.  RECOMMENDATIONS: Followup on pathology. Further recommendations to follow.   _______________________________ eSigned:  R. Roetta Sessions, MD FACP [facility] 09/20/2011 8:40 AM   CC:

## 2011-09-20 NOTE — Progress Notes (Signed)
Continues to sleep soundly. Continues with no response to touch or voice stimuli. resp adequate/nonlabored. O2 continued.

## 2011-09-20 NOTE — H&P (Signed)
Primary Care Physician:  Reynolds Bowl, MD Primary Gastroenterologist:  Dr. Jena Gauss  Pre-Procedure History & Physical: HPI:  [name] is a 51 y.o. female is here for a screening colonoscopy. No bowel symptoms. No prior colonoscopy. No family history of colon polyps or colon cancer.  Past Medical History  Diagnosis Date  . Hypertension   . Asthma 1998  . Diabetes mellitus 2005  . Sleep apnea   . COPD (chronic obstructive pulmonary disease)     Past Surgical History  Procedure Date  . Cardiac stents     Two-2003;Two-2006  . Cesarean section [id number]  . Dilation and curettage of uterus   . Tubal ligation 1990  . Cardiac catheterization 2003;2006  . Abdominal hysterectomy 04/19/2011    Procedure: HYSTERECTOMY ABDOMINAL;  Surgeon: Tilda Burrow, MD;  Location: [facility];  Service: Gynecology;  Laterality: N/A;  . Umbilical hernia repair 04/19/2011    Procedure: HERNIA REPAIR UMBILICAL ADULT;  Surgeon: Tilda Burrow, MD;  Location: [facility];  Service: Gynecology;  Laterality: N/A;    Prior to Admission medications   Medication Sig Start Date End Date Taking? Authorizing Provider  albuterol (PROVENTIL HFA;VENTOLIN HFA) 108 (90 BASE) MCG/ACT inhaler Inhale 2 puffs into the lungs every 6 (six) hours as needed. For shortness of breath   Yes Historical Provider, MD  CINNAMON PO Take 1 capsule by mouth 2 (two) times daily.   Yes Historical Provider, MD  clopidogrel (PLAVIX) 75 MG tablet Take 75 mg by mouth daily.   Yes Historical Provider, MD  glipiZIDE (GLUCOTROL) 10 MG tablet Take 10 mg by mouth 2 (two) times daily before a meal.   Yes Historical Provider, MD  metFORMIN (GLUCOPHAGE) 1000 MG tablet Take 1,000 mg by mouth 2 (two) times daily with a meal.   Yes Historical Provider, MD  pravastatin (PRAVACHOL) 20 MG tablet Take 20 mg by mouth daily.   Yes Historical Provider, MD    Allergies as of 08/16/2011 - Review Complete 08/16/2011  Allergen Reaction Noted  . Azithromycin  Nausea And Vomiting 04/09/2011    Family History  Problem Relation Age of Onset  . Heart attack Mother   . Kidney failure Father     History   Social History  . Marital Status: Married    Spouse Name: N/A    Number of Children: N/A  . Years of Education: N/A   Occupational History  . Not on file.   Social History Main Topics  . Smoking status: Never Smoker   . Smokeless tobacco: Not on file  . Alcohol Use: No  . Drug Use: No  . Sexually Active:    Other Topics Concern  . Not on file   Social History Narrative  . No narrative on file    Review of Systems: See HPI, otherwise negative ROS  Physical Exam: BP 153/72  Pulse 56  Temp 98.1 F (36.7 C) (Oral)  Resp 18  SpO2 97%  LMP 03/26/2011 General:   Alert,  Well-developed, well-nourished, pleasant and cooperative in NAD Head:  Normocephalic and atraumatic. Eyes:  Sclera clear, no icterus.   Conjunctiva pink. Ears:  Normal auditory acuity. Nose:  No deformity, discharge,  or lesions. Mouth:  No deformity or lesions, dentition normal. Neck:  Supple; no masses or thyromegaly. Lungs:  Clear throughout to auscultation.   No wheezes, crackles, or rhonchi. No acute distress. Heart:  Regular rate and rhythm; no murmurs, clicks, rubs,  or gallops. Abdomen: Obese. Positive bowel sounds soft nontender  without obvious mass or organomegaly Msk:  Symmetrical without gross deformities. Normal posture. Pulses:  Normal pulses noted. Extremities:  Without clubbing or edema. Neurologic:  Alert and  oriented x4;  grossly normal neurologically. Skin:  Intact without significant lesions or rashes. Cervical Nodes:  No significant cervical adenopathy. Psych:  Alert and cooperative. Normal mood and affect.  Impression/Plan: [name] is now here to undergo a screening colonoscopy.  First-ever average risk screening examination to  Risks, benefits, limitations, imponderables and alternatives regarding colonoscopy have been  reviewed with the patient. Questions have been answered. All parties agreeable.

## 2011-09-20 NOTE — Progress Notes (Signed)
Sleeping soundly. No response to verbal or touch stimuli. Cool cloth to face. No response. resp adequate/nonlabored. O2 continued.

## 2011-09-22 ENCOUNTER — Encounter: Payer: Self-pay | Admitting: Internal Medicine

## 2011-09-22 ENCOUNTER — Encounter: Payer: Self-pay | Admitting: *Deleted

## 2011-09-22 ENCOUNTER — Encounter (HOSPITAL_COMMUNITY): Payer: Self-pay | Admitting: Internal Medicine

## 2012-01-10 ENCOUNTER — Encounter (HOSPITAL_COMMUNITY): Payer: Self-pay | Admitting: Emergency Medicine

## 2012-01-10 ENCOUNTER — Emergency Department (HOSPITAL_COMMUNITY): Payer: PRIVATE HEALTH INSURANCE

## 2012-01-10 ENCOUNTER — Emergency Department (HOSPITAL_COMMUNITY)
Admission: EM | Admit: 2012-01-10 | Discharge: 2012-01-10 | Disposition: A | Discharge status: Home / Self Care | Payer: PRIVATE HEALTH INSURANCE | Attending: Emergency Medicine | Admitting: Emergency Medicine

## 2012-01-10 DIAGNOSIS — Z7982 Long term (current) use of aspirin: Secondary | ICD-10-CM | POA: Insufficient documentation

## 2012-01-10 DIAGNOSIS — R112 Nausea with vomiting, unspecified: Secondary | ICD-10-CM | POA: Insufficient documentation

## 2012-01-10 DIAGNOSIS — G4730 Sleep apnea, unspecified: Secondary | ICD-10-CM | POA: Insufficient documentation

## 2012-01-10 DIAGNOSIS — I10 Essential (primary) hypertension: Secondary | ICD-10-CM | POA: Insufficient documentation

## 2012-01-10 DIAGNOSIS — J4489 Other specified chronic obstructive pulmonary disease: Secondary | ICD-10-CM | POA: Insufficient documentation

## 2012-01-10 DIAGNOSIS — I2510 Atherosclerotic heart disease of native coronary artery without angina pectoris: Secondary | ICD-10-CM | POA: Insufficient documentation

## 2012-01-10 DIAGNOSIS — J45909 Unspecified asthma, uncomplicated: Secondary | ICD-10-CM | POA: Insufficient documentation

## 2012-01-10 DIAGNOSIS — N200 Calculus of kidney: Secondary | ICD-10-CM | POA: Insufficient documentation

## 2012-01-10 DIAGNOSIS — Z79899 Other long term (current) drug therapy: Secondary | ICD-10-CM | POA: Insufficient documentation

## 2012-01-10 DIAGNOSIS — E119 Type 2 diabetes mellitus without complications: Secondary | ICD-10-CM | POA: Insufficient documentation

## 2012-01-10 DIAGNOSIS — J449 Chronic obstructive pulmonary disease, unspecified: Secondary | ICD-10-CM | POA: Insufficient documentation

## 2012-01-10 HISTORY — DX: Atherosclerotic heart disease of native coronary artery without angina pectoris: I25.10

## 2012-01-10 LAB — URINE MICROSCOPIC-ADD ON

## 2012-01-10 LAB — URINALYSIS, ROUTINE W REFLEX MICROSCOPIC
Nitrite: NEGATIVE
Specific Gravity, Urine: >1.03 — ABNORMAL HIGH (ref 1.005–1.030)
Urobilinogen, UA: 0.2 mg/dL (ref 0.0–1.0)

## 2012-01-10 MED ORDER — ONDANSETRON HCL 4 MG PO TABS: 4.0000 mg | ORAL_TABLET | Freq: Four times a day (QID) | ORAL | Status: DC

## 2012-01-10 MED ORDER — SODIUM CHLORIDE 0.9 % IV SOLN
Freq: Once | INTRAVENOUS | Status: AC
  Administered 2012-01-10: 20 mL/h via INTRAVENOUS

## 2012-01-10 MED ORDER — ONDANSETRON HCL 4 MG/2ML IJ SOLN: 4.0000 mg | Freq: Once | INTRAMUSCULAR | Status: DC

## 2012-01-10 MED ORDER — ONDANSETRON HCL 4 MG/2ML IJ SOLN
INTRAMUSCULAR | Status: AC
  Filled 2012-01-10: qty 2

## 2012-01-10 MED ORDER — HYDROCODONE-ACETAMINOPHEN 5-325 MG PO TABS: 1.0000 | ORAL_TABLET | ORAL | Status: AC | PRN

## 2012-01-10 MED ORDER — KETOROLAC TROMETHAMINE 30 MG/ML IJ SOLN
30.0000 mg | Freq: Once | INTRAMUSCULAR | Status: AC
  Administered 2012-01-10: 30 mg via INTRAVENOUS
  Filled 2012-01-10: qty 1

## 2012-01-10 MED ORDER — FENTANYL CITRATE 0.05 MG/ML IJ SOLN
100.0000 ug | Freq: Once | INTRAMUSCULAR | Status: AC
  Administered 2012-01-10: 100 ug via INTRAVENOUS
  Filled 2012-01-10: qty 2

## 2012-01-10 NOTE — ED Provider Notes (Signed)
Pt resting comfortably CT shows ureteral stone No signs of uti on urine Referred to urology BP 143/87  Pulse 61  Temp 99.1 F (37.3 C) (Oral)  Resp 20  Ht 4\' 11"  (1.499 m)  Wt 207 lb (93.895 kg)  BMI 41.81 kg/m2  SpO2 100%  LMP 03/26/2011   Joya Gaskins, MD 01/10/12 [phone]

## 2012-01-10 NOTE — ED Notes (Signed)
Onset of flank pain left side with nausea and vomiting

## 2012-01-10 NOTE — ED Provider Notes (Signed)
History     CSN: [id number]  Arrival date & time 01/10/12  0609   First MD Initiated Contact with Patient 01/10/12 [phone]      No chief complaint on file.   (Consider location/radiation/quality/duration/timing/severity/associated sxs/prior treatment) HPI [name] is a 52 y.o. female who presents to the Emergency Department complaining of left flank pian that began last night and woke her from sleep. Pain has gotten progressively worse. She has a h/o kidney stones, most of which she has had to have lithotripsy.Denies fever, chills. Has had nausea and vomiting with the current pain.   PCP Dr. Jorene Guest Past Medical History  Diagnosis Date  . Hypertension   . Asthma 1998  . Diabetes mellitus 2005  . Sleep apnea   . COPD (chronic obstructive pulmonary disease)   . Coronary artery disease     Past Surgical History  Procedure Date  . Cardiac stents     Two-2003;Two-2006  . Cesarean section [id number]  . Dilation and curettage of uterus   . Tubal ligation 1990  . Cardiac catheterization 2003;2006  . Abdominal hysterectomy 04/19/2011    Procedure: HYSTERECTOMY ABDOMINAL;  Surgeon: Tilda Burrow, MD;  Location: [facility];  Service: Gynecology;  Laterality: N/A;  . Umbilical hernia repair 04/19/2011    Procedure: HERNIA REPAIR UMBILICAL ADULT;  Surgeon: Tilda Burrow, MD;  Location: [facility];  Service: Gynecology;  Laterality: N/A;  . Colonoscopy 09/20/2011    Procedure: COLONOSCOPY;  Surgeon: Corbin Ade, MD;  Location: [facility];  Service: Endoscopy;  Laterality: N/A;  8:30 AM    Family History  Problem Relation Age of Onset  . Heart attack Mother   . Kidney failure Father     History  Substance Use Topics  . Smoking status: Never Smoker   . Smokeless tobacco: Not on file  . Alcohol Use: No    OB History    Grav Para Term Preterm Abortions TAB SAB Ect Mult Living                  Review of Systems  Constitutional: Negative for fever.       10 Systems  reviewed and are negative for acute change except as noted in the HPI.  HENT: Negative for congestion.   Eyes: Negative for discharge and redness.  Respiratory: Negative for cough and shortness of breath.   Cardiovascular: Negative for chest pain.  Gastrointestinal: Positive for nausea and vomiting. Negative for abdominal pain.  Genitourinary: Positive for flank pain.  Musculoskeletal: Negative for back pain.  Skin: Negative for rash.  Neurological: Negative for syncope, numbness and headaches.  Psychiatric/Behavioral:       No behavior change.    Allergies  Azithromycin  Home Medications   Current Outpatient Rx  Name  Route  Sig  Dispense  Refill  . ALBUTEROL SULFATE HFA 108 (90 BASE) MCG/ACT IN AERS   Inhalation   Inhale 2 puffs into the lungs every 6 (six) hours as needed. For shortness of breath         . ASPIRIN BUFFERED 325 MG PO TABS   Oral   Take 325 mg by mouth daily.         Marland Kitchen CLOPIDOGREL BISULFATE 75 MG PO TABS   Oral   Take 75 mg by mouth daily.         Marland Kitchen GLIPIZIDE 10 MG PO TABS   Oral   Take 10 mg by mouth 2 (two) times daily before a  meal.         . METFORMIN HCL 1000 MG PO TABS   Oral   Take 1,000 mg by mouth 2 (two) times daily with a meal.         . PRAVASTATIN SODIUM 20 MG PO TABS   Oral   Take 20 mg by mouth daily.         Marland Kitchen CINNAMON PO   Oral   Take 1 capsule by mouth 2 (two) times daily.           BP 197/87  Pulse 61  Temp 99.1 F (37.3 C) (Oral)  Resp 21  Ht 4\' 11"  (1.499 m)  Wt 207 lb (93.895 kg)  BMI 41.81 kg/m2  SpO2 98%  LMP 03/26/2011  Physical Exam  Nursing note and vitals reviewed. Constitutional:       Awake, alert, nontoxic appearance.  HENT:  Head: Atraumatic.  Eyes: Right eye exhibits no discharge. Left eye exhibits no discharge.  Neck: Neck supple.  Pulmonary/Chest: Effort normal. She exhibits no tenderness.  Abdominal: Soft. There is no tenderness. There is no rebound.  Genitourinary:       Left  cva tenderness to percussion  Musculoskeletal: She exhibits no tenderness.       Baseline ROM, no obvious new focal weakness.  Neurological:       Mental status and motor strength appears baseline for patient and situation.  Skin: No rash noted.  Psychiatric: She has a normal mood and affect.    ED Course  Procedures (including critical care time)  Results for orders placed during the hospital encounter of 01/10/12  URINALYSIS, ROUTINE W REFLEX MICROSCOPIC      Component Value Range   Color, Urine YELLOW  YELLOW   APPearance CLEAR  CLEAR   Specific Gravity, Urine >1.030 (*) 1.005 - 1.030   pH 5.5  5.0 - 8.0   Glucose, UA NEGATIVE  NEGATIVE mg/dL   Hgb urine dipstick TRACE (*) NEGATIVE   Bilirubin Urine NEGATIVE  NEGATIVE   Ketones, ur NEGATIVE  NEGATIVE mg/dL   Protein, ur 413 (*) NEGATIVE mg/dL   Urobilinogen, UA 0.2  0.0 - 1.0 mg/dL   Nitrite NEGATIVE  NEGATIVE   Leukocytes, UA NEGATIVE  NEGATIVE  URINE MICROSCOPIC-ADD ON      Component Value Range   Squamous Epithelial / LPF RARE  RARE   WBC, UA 0-2  <3 WBC/hpf   RBC / HPF 0-2  <3 RBC/hpf   Bacteria, UA RARE  RARE      MDM  Patient presents with left flank pain similar to previous pain experienced with kidney stones. Associated with nausea and vomiting. Given antiemetic and toradol with relief. UA with trace hgb. Awaiting CT scan to r/o stone. Care/disposition to Dr. Bebe Shaggy.   MDM Reviewed: nursing note and vitals Interpretation: labs and CT scan           Nicoletta Dress. Colon Branch, MD 01/19/12 [phone]

## 2013-05-05 ENCOUNTER — Encounter (HOSPITAL_COMMUNITY): Payer: Self-pay | Admitting: Emergency Medicine

## 2013-05-05 ENCOUNTER — Emergency Department (HOSPITAL_COMMUNITY): Payer: BC Managed Care – PPO

## 2013-05-05 ENCOUNTER — Emergency Department (HOSPITAL_COMMUNITY)
Admission: EM | Admit: 2013-05-05 | Discharge: 2013-05-05 | Disposition: A | Discharge status: Home / Self Care | Payer: BC Managed Care – PPO | Attending: Emergency Medicine | Admitting: Emergency Medicine

## 2013-05-05 DIAGNOSIS — X503XXA Overexertion from repetitive movements, initial encounter: Secondary | ICD-10-CM | POA: Insufficient documentation

## 2013-05-05 DIAGNOSIS — Z7902 Long term (current) use of antithrombotics/antiplatelets: Secondary | ICD-10-CM | POA: Insufficient documentation

## 2013-05-05 DIAGNOSIS — IMO0002 Reserved for concepts with insufficient information to code with codable children: Secondary | ICD-10-CM | POA: Insufficient documentation

## 2013-05-05 DIAGNOSIS — Y9389 Activity, other specified: Secondary | ICD-10-CM | POA: Insufficient documentation

## 2013-05-05 DIAGNOSIS — I10 Essential (primary) hypertension: Secondary | ICD-10-CM | POA: Insufficient documentation

## 2013-05-05 DIAGNOSIS — J449 Chronic obstructive pulmonary disease, unspecified: Secondary | ICD-10-CM | POA: Insufficient documentation

## 2013-05-05 DIAGNOSIS — E119 Type 2 diabetes mellitus without complications: Secondary | ICD-10-CM | POA: Insufficient documentation

## 2013-05-05 DIAGNOSIS — I2510 Atherosclerotic heart disease of native coronary artery without angina pectoris: Secondary | ICD-10-CM | POA: Insufficient documentation

## 2013-05-05 DIAGNOSIS — Z7982 Long term (current) use of aspirin: Secondary | ICD-10-CM | POA: Insufficient documentation

## 2013-05-05 DIAGNOSIS — Z79899 Other long term (current) drug therapy: Secondary | ICD-10-CM | POA: Insufficient documentation

## 2013-05-05 DIAGNOSIS — Z9861 Coronary angioplasty status: Secondary | ICD-10-CM | POA: Insufficient documentation

## 2013-05-05 DIAGNOSIS — J4489 Other specified chronic obstructive pulmonary disease: Secondary | ICD-10-CM | POA: Insufficient documentation

## 2013-05-05 DIAGNOSIS — Z9889 Other specified postprocedural states: Secondary | ICD-10-CM | POA: Insufficient documentation

## 2013-05-05 DIAGNOSIS — S46912A Strain of unspecified muscle, fascia and tendon at shoulder and upper arm level, left arm, initial encounter: Secondary | ICD-10-CM

## 2013-05-05 DIAGNOSIS — Y929 Unspecified place or not applicable: Secondary | ICD-10-CM | POA: Insufficient documentation

## 2013-05-05 MED ORDER — HYDROCODONE-ACETAMINOPHEN 5-325 MG PO TABS
1.0000 | ORAL_TABLET | Freq: Once | ORAL | Status: AC
  Administered 2013-05-05: 1 via ORAL
  Filled 2013-05-05: qty 1

## 2013-05-05 MED ORDER — HYDROCODONE-ACETAMINOPHEN 5-325 MG PO TABS: ORAL_TABLET | ORAL | Status: DC

## 2013-05-05 MED ORDER — IBUPROFEN 800 MG PO TABS
800.0000 mg | ORAL_TABLET | Freq: Once | ORAL | Status: AC
  Administered 2013-05-05: 800 mg via ORAL
  Filled 2013-05-05: qty 1

## 2013-05-05 NOTE — Discharge Instructions (Signed)
Shoulder Sprain  A shoulder sprain is the result of damage to the tough, fiber-like tissues (ligaments) that help hold your shoulder in place. The ligaments may be stretched or torn. Besides the main shoulder joint (the ball and socket), there are several smaller joints that connect the bones in this area. A sprain usually involves one of those joints. Most often it is the acromioclavicular (or AC) joint. That is the joint that connects the collarbone (clavicle) and the shoulder blade (scapula) at the top point of the shoulder blade (acromion).  A shoulder sprain is a mild form of what is called a shoulder separation. Recovering from a shoulder sprain may take some time. For some, pain lingers for several months. Most people recover without long term problems.  CAUSES    A shoulder sprain is usually caused by some kind of trauma. This might be:   Falling on an outstretched arm.   Being hit hard on the shoulder.   Twisting the arm.   Shoulder sprains are more likely to occur in people who:   Play sports.   Have balance or coordination problems.  SYMPTOMS    Pain when you move your shoulder.   Limited ability to move the shoulder.   Swelling and tenderness on top of the shoulder.   Redness or warmth in the shoulder.   Bruising.   A change in the shape of the shoulder.  DIAGNOSIS   Your healthcare provider may:   Ask about your symptoms.   Ask about recent activity that might have caused those symptoms.   Examine your shoulder. You may be asked to do simple exercises to test movement. The other shoulder will be examined for comparison.   Order some tests that provide a look inside the body. They can show the extent of the injury. The tests could include:   X-rays.   CT (computed tomography) scan.   MRI (magnetic resonance imaging) scan.  RISKS AND COMPLICATIONS   Loss of full shoulder motion.   Ongoing shoulder pain.  TREATMENT   How long it takes to recover from a shoulder sprain depends on how  severe it was. Treatment options may include:   Rest. You should not use the arm or shoulder until it heals.   Ice. For 2 or 3 days after the injury, put an ice pack on the shoulder up to 4 times a day. It should stay on for 15 to 20 minutes each time. Wrap the ice in a towel so it does not touch your skin.   Over-the-counter medicine to relieve pain.   A sling or brace. This will keep the arm still while the shoulder is healing.   Physical therapy or rehabilitation exercises. These will help you regain strength and motion. Ask your healthcare provider when it is OK to begin these exercises.   Surgery. The need for surgery is rare with a sprained shoulder, but some people may need surgery to keep the joint in place and reduce pain.  HOME CARE INSTRUCTIONS    Ask your healthcare provider about what you should and should not do while your shoulder heals.   Make sure you know how to apply ice to the correct area of your shoulder.   Talk with your healthcare provider about which medications should be used for pain and swelling.   If rehabilitation therapy will be needed, ask your healthcare provider to refer you to a therapist. If it is not recommended, then ask about at-home exercises. Find   out when exercise should begin.  SEEK MEDICAL CARE IF:   Your pain, swelling, or redness at the joint increases.  SEEK IMMEDIATE MEDICAL CARE IF:    You have a fever.   You cannot move your arm or shoulder.  Document Released: 05/09/2008 Document Revised: 03/15/2011 Document Reviewed: 05/09/2008  [employer] Patient Information 2014 [employer]

## 2013-05-05 NOTE — ED Notes (Signed)
Pt states she was lifting furniture today when she heard a "pop" in her shoulder. Presents now with pain to L. Shoulder rated 10/10. ROM significantly decreased at this time.

## 2013-05-05 NOTE — ED Notes (Signed)
Pt was lifting a couch and heard a pop in her left shoulder. Pt unable to raise arm in triage.

## 2013-05-06 NOTE — ED Provider Notes (Signed)
CSN: [id number]     Arrival date & time 05/05/13  2122 History   First MD Initiated Contact with Patient 05/05/13 2234     Chief Complaint  Patient presents with  . Shoulder Injury     (Consider location/radiation/quality/duration/timing/severity/associated sxs/prior Treatment) Patient is a 53 y.o. female presenting with shoulder injury. The history is provided by the patient.  Shoulder Injury This is a new problem. The current episode started today. The problem occurs constantly. The problem has been unchanged. Associated symptoms include arthralgias. Pertinent negatives include no abdominal pain, chest pain, chills, fever, headaches, joint swelling, nausea, neck pain, numbness, rash, sore throat, visual change or vomiting. Exacerbated by: movement of the left arm and palpation of the shoulder joint. She has tried nothing for the symptoms. The treatment provided no relief.   patient reports sudden pain to her left shoulder after helping someone lift a sofa.  She states that she felt a "pop" in her shoulder and had to set the sofa down.  She denies dropping it or fall.  Pain now with any movement of the left arm.  Pain resolves it the arm is kept held to her body.  She denies chest pain, shortness of breath, neck pain, weakness or numbness.    Past Medical History  Diagnosis Date  . Hypertension   . Asthma 1998  . Diabetes mellitus 2005  . Sleep apnea   . COPD (chronic obstructive pulmonary disease)   . Coronary artery disease    Past Surgical History  Procedure Laterality Date  . Cardiac stents      Two-2003;Two-2006  . Cesarean section  [id number]  . Dilation and curettage of uterus    . Tubal ligation  1990  . Cardiac catheterization  2003;2006  . Abdominal hysterectomy  04/19/2011    Procedure: HYSTERECTOMY ABDOMINAL;  Surgeon: Jonnie Kind, MD;  Location: [facility];  Service: Gynecology;  Laterality: N/A;  . Umbilical hernia repair  04/19/2011    Procedure: HERNIA REPAIR UMBILICAL  ADULT;  Surgeon: Jonnie Kind, MD;  Location: [facility];  Service: Gynecology;  Laterality: N/A;  . Colonoscopy  09/20/2011    Procedure: COLONOSCOPY;  Surgeon: Daneil Dolin, MD;  Location: [facility];  Service: Endoscopy;  Laterality: N/A;  8:30 AM   Family History  Problem Relation Age of Onset  . Heart attack Mother   . Kidney failure Father    History  Substance Use Topics  . Smoking status: Never Smoker   . Smokeless tobacco: Not on file  . Alcohol Use: No   OB History   Grav Para Term Preterm Abortions TAB SAB Ect Mult Living                 Review of Systems  Constitutional: Negative for fever and chills.  HENT: Negative for sore throat.   Respiratory: Negative for shortness of breath.   Cardiovascular: Negative for chest pain.  Gastrointestinal: Negative for nausea, vomiting and abdominal pain.  Genitourinary: Negative for dysuria and difficulty urinating.  Musculoskeletal: Positive for arthralgias. Negative for back pain, joint swelling, neck pain and neck stiffness.  Skin: Negative for color change, rash and wound.  Neurological: Negative for dizziness, numbness and headaches.  All other systems reviewed and are negative.     Allergies  Azithromycin; Lantus; and Tramadol  Home Medications   Prior to Admission medications   Medication Sig Start Date End Date Taking? Authorizing Provider  aspirin EC 81 MG tablet Take 81 mg by  mouth daily.   Yes Historical Provider, MD  clopidogrel (PLAVIX) 75 MG tablet Take 75 mg by mouth daily.   Yes Historical Provider, MD  Cyanocobalamin (B-12 PO) Take 1 tablet by mouth daily.   Yes Historical Provider, MD  glipiZIDE (GLUCOTROL) 10 MG tablet Take 10 mg by mouth every morning.    Yes Historical Provider, MD  metFORMIN (GLUCOPHAGE) 1000 MG tablet Take 1,000 mg by mouth 2 (two) times daily with a meal.   Yes Historical Provider, MD  metoprolol tartrate (LOPRESSOR) 25 MG tablet Take 25 mg by mouth 2 (two) times daily.   Yes  Historical Provider, MD  pravastatin (PRAVACHOL) 40 MG tablet Take 40 mg by mouth at bedtime.   Yes Historical Provider, MD  vitamin E 400 UNIT capsule Take 400 Units by mouth daily.   Yes Historical Provider, MD  albuterol (PROVENTIL HFA;VENTOLIN HFA) 108 (90 BASE) MCG/ACT inhaler Inhale 2 puffs into the lungs every 6 (six) hours as needed. For shortness of breath    Historical Provider, MD  HYDROcodone-acetaminophen (NORCO/VICODIN) 5-325 MG per tablet Take one-two tabs po q 4-6 hrs prn pain 05/05/13   Aishia Barkey L. Arafat Cocuzza, PA-C   BP 152/76  Pulse 69  Temp(Src) 97.9 F (36.6 C)  Resp 20  Ht 4\' 11"  (1.499 m)  Wt 200 lb (90.719 kg)  BMI 40.37 kg/m2  SpO2 99%  LMP 03/26/2011 Physical Exam  Nursing note and vitals reviewed. Constitutional: She is oriented to person, place, and time. She appears well-developed and well-nourished. No distress.  HENT:  Head: Normocephalic and atraumatic.  Neck: Normal range of motion. Neck supple. No thyromegaly present.  Cardiovascular: Normal rate, regular rhythm, normal heart sounds and intact distal pulses.   No murmur heard. Pulmonary/Chest: Effort normal and breath sounds normal. No respiratory distress. She exhibits no tenderness.  Musculoskeletal: She exhibits tenderness. She exhibits no edema.  Diffuse ttp of the left shoulder.  Pain with abduction of the left arm and rotation of the shoulder.  Radial pulse is brisk, distal sensation intact, CR< 2 sec. Grip strength is strong and symmetrical.   No abrasions, edema , erythema or step-off deformity of the joint.   Lymphadenopathy:    She has no cervical adenopathy.  Neurological: She is alert and oriented to person, place, and time. She has normal strength. No sensory deficit. She exhibits normal muscle tone. Coordination normal.  Skin: Skin is warm and dry.    ED Course  Procedures (including critical care time) Labs Review Labs Reviewed - No data to display  Imaging Review Dg Shoulder  Left  05/05/2013   CLINICAL DATA:  Shoulder pain after lifting injury.  EXAM: LEFT SHOULDER - 2+ VIEW  COMPARISON:  None.  FINDINGS: There is no evidence of fracture or dislocation. There is no evidence of arthropathy or other focal bone abnormality. Soft tissues are unremarkable.  IMPRESSION: Negative.   Electronically Signed   By: Lucienne Capers M.D.   On: 05/05/2013 22:31     EKG Interpretation None      MDM   Final diagnoses:  Left shoulder strain    Pt is well appearing, non-toxic.  No hx of fall.  Pain with attempted abduction or rotation of the left shoulder.  No step-off deformity, XR reassuring.  I have advised pt of possible rotator cuff injury and she agrees to close f/u with orthopedics, referral info given for Dr. Aline Brochure.  Pt given a sling ,but advised not to wear sling constantly and to perform  small ROM exercise with the shoulder several times a day and to apply ice  Pt agrees to  Plan and verbalized understanding.  She appears stable for d/c.      Geronimo Diliberto L. [place], PA-C 05/06/13 2104

## 2013-05-07 NOTE — ED Provider Notes (Signed)
Medical screening examination/treatment/procedure(s) were performed by non-physician practitioner and as supervising physician I was immediately available for consultation/collaboration.   EKG Interpretation None        Alfonzo Feller, DO 05/07/13 1407

## 2013-05-14 ENCOUNTER — Encounter: Payer: Self-pay | Admitting: Orthopedic Surgery

## 2013-05-14 ENCOUNTER — Ambulatory Visit (INDEPENDENT_AMBULATORY_CARE_PROVIDER_SITE_OTHER): Payer: BC Managed Care – PPO | Admitting: Orthopedic Surgery

## 2013-05-14 VITALS — BP 142/82 | Ht 59.0 in | Wt 200.0 lb

## 2013-05-14 DIAGNOSIS — M719 Bursopathy, unspecified: Secondary | ICD-10-CM

## 2013-05-14 DIAGNOSIS — M67919 Unspecified disorder of synovium and tendon, unspecified shoulder: Secondary | ICD-10-CM

## 2013-05-14 DIAGNOSIS — M75100 Unspecified rotator cuff tear or rupture of unspecified shoulder, not specified as traumatic: Secondary | ICD-10-CM | POA: Insufficient documentation

## 2013-05-14 MED ORDER — HYDROCODONE-ACETAMINOPHEN 7.5-325 MG PO TABS: 1.0000 | ORAL_TABLET | ORAL | Status: DC | PRN

## 2013-05-14 NOTE — Patient Instructions (Addendum)
Call to arrange PT 3 times a week for 4 weeks OOW note for 2 weeks

## 2013-05-14 NOTE — Progress Notes (Signed)
Patient ID: [name], female   DOB: Jun 20, 1960, 53 y.o.   MRN: [id number]  Chief Complaint  Patient presents with  . Shoulder Pain    Left shoulder pain, DOI 05-05-13.   HISTORY: 53 year old female who was lifting a couch her shoulder pop she felt acute pain on May 2. She now complains of a catching sensation when she's trying to move her arm she has pain over the left shoulder with aching which radiates into the upper arm towards the elbow. The pain 5/10. She had x-rays that were normal. She has weakness on forward elevation.  She has medical history of diabetes asthma hypertension heart disease. Her surgeries include 2 cesarean sections one in 80 and 2 and 89. History  Substance Use Topics  . Smoking status: Never Smoker   . Smokeless tobacco: Not on file  . Alcohol Use: No    BP 142/82  Ht 4\' 11"  (1.499 m)  Wt 200 lb (90.719 kg)  BMI 40.37 kg/m2  LMP 03/26/2011 General appearance is normal, the patient is alert and oriented x3 with normal mood and affect.  Ambulation remains normal  The right shoulder has full range of motion. The left shoulder is tender and weak she has 4/5 manual muscle testing of the supraspinatus while the infraspinatus and internal rotation strength is normal. No instability noted she has a positive impingement sign her scans intact good distal radial pulse her sensation is normal there is no lymphadenopathy.  X-rays are negative  Impression rotator cuff syndrome but she may even have rotator cuff tear or proximal biceps tendon tear  Recommend injection, therapy, medication and then return for reevaluation in 2 weeks if no improvement recommend MRI  She should have out of work note as well.  Procedure inject subacromial space left shoulder Diagnosis rotator cuff syndrome left shoulder Medication Depo-Medrol 40 mg, 1 cc and lidocaine 1% 3 cc Verbal consent Timeout completed  The injection site was cleaned with alcohol and sprayed with ethyl  chloride. From a posterior approach a 20-gauge needle was injected in the subacromial space. The medication went in easily. There were no complications. The wound was covered with a sterile bandage. Appropriate precautions were given.

## 2013-05-29 ENCOUNTER — Encounter: Payer: Self-pay | Admitting: Orthopedic Surgery

## 2013-05-29 ENCOUNTER — Ambulatory Visit (INDEPENDENT_AMBULATORY_CARE_PROVIDER_SITE_OTHER): Payer: BC Managed Care – PPO | Admitting: Orthopedic Surgery

## 2013-05-29 VITALS — BP 155/98 | Ht 59.0 in | Wt 200.0 lb

## 2013-05-29 DIAGNOSIS — M719 Bursopathy, unspecified: Secondary | ICD-10-CM

## 2013-05-29 DIAGNOSIS — S43429A Sprain of unspecified rotator cuff capsule, initial encounter: Secondary | ICD-10-CM

## 2013-05-29 DIAGNOSIS — M75100 Unspecified rotator cuff tear or rupture of unspecified shoulder, not specified as traumatic: Secondary | ICD-10-CM | POA: Insufficient documentation

## 2013-05-29 DIAGNOSIS — M67919 Unspecified disorder of synovium and tendon, unspecified shoulder: Secondary | ICD-10-CM

## 2013-05-29 NOTE — Progress Notes (Signed)
Patient ID: TEASHA MURRILLO, female   DOB: 07/06/1960, 53 y.o.   MRN: [id number] Chief complaint left shoulder pain injured may second. This patient 53 years old she lifted a couch felt a pop in her arm 3-1/2 weeks ago, gave her an injection no improvement I sent her to therapy no improvement. She took some ibuprofen. Still no improvement  Apparently no numbness or tingling  General appearance is normal, the patient is alert and oriented x3 with normal mood and affect. Range of motion Limited abduction limited flexion less than 90 Comes in now with continued pain and weakness with 4/5 manual muscle testing of supraspinatus.  Impingement sign positive peri-acromial tenderness anterior deltoid posterior deltoid.  A diagnosed initially with rotator cuff syndrome but she has differential diagnosis of rotator cuff tear. Failed therapy injection in the sense with weakness and a traumatic injury to the left shoulder recommend MRI left shoulder if the patient can get me the records related to her stance that there MRI compatible if not then we will do an arthrogram  In the meantime continue therapy continue out of work

## 2013-05-29 NOTE — Patient Instructions (Addendum)
Call [facility] check for stent compatibility with MRI; and [facility]  And call us back   Continue therapy

## 2013-06-11 ENCOUNTER — Telehealth: Payer: Self-pay | Admitting: Orthopedic Surgery

## 2013-06-11 NOTE — Telephone Encounter (Signed)
Please advise patient, she has called, and has stopped in -- medical records and "stent card" received from [place] Dr Aline Brochure had reviewed and has brought the records to front office.  Last note indicates we are awaiting the stent information.  Patient is inquiring about MRI - also said "feeling better"; she has asked for follow up appointment, which I relayed is pending all of this information. Patient's ph#'s [phone] [facility]) [phone] [facility])

## 2013-06-12 NOTE — Telephone Encounter (Signed)
Received information and placed in DR. Harrison's box for review.

## 2013-06-18 ENCOUNTER — Telehealth: Payer: Self-pay | Admitting: Orthopedic Surgery

## 2013-06-18 NOTE — Telephone Encounter (Signed)
Patient called - aware MRI has been pending, per previous note, some additional medical records which Dr Aline Brochure was reviewing, regarding cardiac history and type of stent.  Patient states her shoulder is feeling better, and she therefore would like to schedule a follow up visit to re-evaluate shoulder, and have Dr Aline Brochure determine her return to work.  Please advise.  Patient ph [phone] (Home).

## 2013-06-18 NOTE — Telephone Encounter (Signed)
Routing to Dr Harrison 

## 2013-06-19 ENCOUNTER — Encounter: Payer: Self-pay | Admitting: Orthopedic Surgery

## 2013-06-19 NOTE — Telephone Encounter (Signed)
Routing to [employer] to schedule

## 2013-06-19 NOTE — Telephone Encounter (Signed)
OK BUT IT WILL HAVE TO BE WHEN I GET BACK

## 2013-06-19 NOTE — Telephone Encounter (Signed)
I called patient earlier this afternoon, and have scheduled for 07/05/13, and also have updated patient's work note, which is being faxed to her employer, as well as update to her [employer], all of whom we have signed authorizations on file.  Patient aware.

## 2013-07-05 ENCOUNTER — Ambulatory Visit (INDEPENDENT_AMBULATORY_CARE_PROVIDER_SITE_OTHER): Payer: 59 | Admitting: Orthopedic Surgery

## 2013-07-05 ENCOUNTER — Encounter: Payer: Self-pay | Admitting: Orthopedic Surgery

## 2013-07-05 DIAGNOSIS — M719 Bursopathy, unspecified: Secondary | ICD-10-CM

## 2013-07-05 DIAGNOSIS — M75102 Unspecified rotator cuff tear or rupture of left shoulder, not specified as traumatic: Secondary | ICD-10-CM

## 2013-07-05 DIAGNOSIS — M67919 Unspecified disorder of synovium and tendon, unspecified shoulder: Secondary | ICD-10-CM

## 2013-07-05 NOTE — Patient Instructions (Signed)
RTW 

## 2013-07-05 NOTE — Progress Notes (Signed)
Patient ID: [name], female   DOB: [birth year], 53 y.o.   MRN: [id number] Chief Complaint  Patient presents with  . Shoulder Problem    Followup left shoulder    Follow patient reports improved symptoms. She was coming to get an MRI of the shoulder but she has a stent in the coming out those records for now the shoulders are bothering her she wishes to return to work  She complains of no pain stiffness, no numbness or tingling.  There is no swelling of the shoulder no tenderness or range of motion is returned to normal she stable in abduction external rotation manual muscle testing supraspinatus strength grade 5 skin normal normal sensation left arm  Right arm for range of motion matches left  Impression  Rotator cuff syndrome resolved  Follow up as needed return to work she will give [place] the date

## 2013-08-12 ENCOUNTER — Emergency Department (HOSPITAL_COMMUNITY): Payer: 59

## 2013-08-12 ENCOUNTER — Emergency Department (HOSPITAL_COMMUNITY)
Admission: EM | Admit: 2013-08-12 | Discharge: 2013-08-12 | Disposition: A | Discharge status: Home / Self Care | Payer: 59 | Attending: Emergency Medicine | Admitting: Emergency Medicine

## 2013-08-12 ENCOUNTER — Encounter (HOSPITAL_COMMUNITY): Payer: Self-pay | Admitting: Emergency Medicine

## 2013-08-12 DIAGNOSIS — X500XXA Overexertion from strenuous movement or load, initial encounter: Secondary | ICD-10-CM | POA: Diagnosis not present

## 2013-08-12 DIAGNOSIS — I2510 Atherosclerotic heart disease of native coronary artery without angina pectoris: Secondary | ICD-10-CM | POA: Insufficient documentation

## 2013-08-12 DIAGNOSIS — M659 Unspecified synovitis and tenosynovitis, unspecified site: Secondary | ICD-10-CM | POA: Insufficient documentation

## 2013-08-12 DIAGNOSIS — Z79899 Other long term (current) drug therapy: Secondary | ICD-10-CM | POA: Insufficient documentation

## 2013-08-12 DIAGNOSIS — J4489 Other specified chronic obstructive pulmonary disease: Secondary | ICD-10-CM | POA: Insufficient documentation

## 2013-08-12 DIAGNOSIS — Z9889 Other specified postprocedural states: Secondary | ICD-10-CM | POA: Diagnosis not present

## 2013-08-12 DIAGNOSIS — Y929 Unspecified place or not applicable: Secondary | ICD-10-CM | POA: Diagnosis not present

## 2013-08-12 DIAGNOSIS — Z7902 Long term (current) use of antithrombotics/antiplatelets: Secondary | ICD-10-CM | POA: Insufficient documentation

## 2013-08-12 DIAGNOSIS — I10 Essential (primary) hypertension: Secondary | ICD-10-CM | POA: Insufficient documentation

## 2013-08-12 DIAGNOSIS — T300 Burn of unspecified body region, unspecified degree: Secondary | ICD-10-CM

## 2013-08-12 DIAGNOSIS — E119 Type 2 diabetes mellitus without complications: Secondary | ICD-10-CM | POA: Diagnosis not present

## 2013-08-12 DIAGNOSIS — X131XXA Other contact with steam and other hot vapors, initial encounter: Secondary | ICD-10-CM

## 2013-08-12 DIAGNOSIS — T2122XA Burn of second degree of abdominal wall, initial encounter: Secondary | ICD-10-CM | POA: Diagnosis not present

## 2013-08-12 DIAGNOSIS — J449 Chronic obstructive pulmonary disease, unspecified: Secondary | ICD-10-CM | POA: Insufficient documentation

## 2013-08-12 DIAGNOSIS — S79929A Unspecified injury of unspecified thigh, initial encounter: Principal | ICD-10-CM

## 2013-08-12 DIAGNOSIS — Y9389 Activity, other specified: Secondary | ICD-10-CM | POA: Diagnosis not present

## 2013-08-12 DIAGNOSIS — M76892 Other specified enthesopathies of left lower limb, excluding foot: Secondary | ICD-10-CM

## 2013-08-12 DIAGNOSIS — X12XXXA Contact with other hot fluids, initial encounter: Secondary | ICD-10-CM | POA: Diagnosis not present

## 2013-08-12 DIAGNOSIS — Z7982 Long term (current) use of aspirin: Secondary | ICD-10-CM | POA: Insufficient documentation

## 2013-08-12 DIAGNOSIS — S79919A Unspecified injury of unspecified hip, initial encounter: Secondary | ICD-10-CM | POA: Insufficient documentation

## 2013-08-12 DIAGNOSIS — Z9861 Coronary angioplasty status: Secondary | ICD-10-CM | POA: Diagnosis not present

## 2013-08-12 MED ORDER — SILVER SULFADIAZINE 1 % EX CREA
TOPICAL_CREAM | Freq: Once | CUTANEOUS | Status: AC
  Administered 2013-08-12: 1 via TOPICAL
  Filled 2013-08-12: qty 50

## 2013-08-12 MED ORDER — NAPROXEN 500 MG PO TABS: 500.0000 mg | ORAL_TABLET | Freq: Two times a day (BID) | ORAL | Status: AC

## 2013-08-12 NOTE — Discharge Instructions (Signed)
Burn Care Your skin is a natural barrier to infection. It is the largest organ of your body. Burns damage this natural protection. To help prevent infection, it is very important to follow your caregiver's instructions in the care of your burn. Burns are classified as:  First degree. There is only redness of the skin (erythema). No scarring is expected.  Second degree. There is blistering of the skin. Scarring may occur with deeper burns.  Third degree. All layers of the skin are injured, and scarring is expected. HOME CARE INSTRUCTIONS   Wash your hands well before changing your bandage.  Change your bandage as often as directed by your caregiver.  Remove the old bandage. If the bandage sticks, you may soak it off with cool, clean water.  Cleanse the burn thoroughly but gently with mild soap and water.  Pat the area dry with a clean, dry cloth.  Apply a thin layer of antibacterial cream to the burn.  Apply a clean bandage as instructed by your caregiver.  Keep the bandage as clean and dry as possible.  Elevate the affected area for the first 24 hours, then as instructed by your caregiver.  Only take over-the-counter or prescription medicines for pain, discomfort, or fever as directed by your caregiver. SEEK IMMEDIATE MEDICAL CARE IF:   You develop excessive pain.  You develop redness, tenderness, swelling, or red streaks near the burn.  The burned area develops yellowish-white fluid (pus) or a bad smell.  You have a fever. MAKE SURE YOU:   Understand these instructions.  Will watch your condition.  Will get help right away if you are not doing well or get worse. Document Released: 12/21/2004 Document Revised: 03/15/2011 Document Reviewed: 05/13/2010 [facility] Patient Information 2015 [place], [place]. This information is not intended to replace advice given to you by your health care provider. Make sure you discuss any questions you have with your health care  provider.  Tendinitis Tendinitis is swelling and inflammation of the tendons. Tendons are band-like tissues that connect muscle to bone. Tendinitis commonly occurs in the:   Shoulders (rotator cuff).  Heels (Achilles tendon).  Elbows (triceps tendon). CAUSES Tendinitis is usually caused by overusing the tendon, muscles, and joints involved. When the tissue surrounding a tendon (synovium) becomes inflamed, it is called tenosynovitis. Tendinitis commonly develops in people whose jobs require repetitive motions. SYMPTOMS  Pain.  Tenderness.  Mild swelling. DIAGNOSIS Tendinitis is usually diagnosed by physical exam. Your health care provider may also order X-rays or other imaging tests. TREATMENT Your health care provider may recommend certain medicines or exercises for your treatment. HOME CARE INSTRUCTIONS   Use a sling or splint for as long as directed by your health care provider until the pain decreases.  Put ice on the injured area.  Put ice in a plastic bag.  Place a towel between your skin and the bag.  Leave the ice on for 15-20 minutes, 3-4 times a day, or as directed by your health care provider.  Avoid using the limb while the tendon is painful. Perform gentle range of motion exercises only as directed by your health care provider. Stop exercises if pain or discomfort increase, unless directed otherwise by your health care provider.  Only take over-the-counter or prescription medicines for pain, discomfort, or fever as directed by your health care provider. SEEK MEDICAL CARE IF:   Your pain and swelling increase.  You develop new, unexplained symptoms, especially increased numbness in the hands. MAKE SURE YOU:  Understand these instructions.  Will watch your condition.  Will get help right away if you are not doing well or get worse. Document Released: 12/19/1999 Document Revised: 05/07/2013 Document Reviewed: 03/09/2010 [facility] Patient Information 2015  [place], [place]. This information is not intended to replace advice given to you by your health care provider. Make sure you discuss any questions you have with your health care provider.   Apply a new layer of silvadene to your burn twice daily after washing with soap and water.  Use an ice pack on your hip 10 minutes every hour while awake for the next 2 days.  You may add a heating pad 20 minutes several times daily starting on Tuesday.  Use the medication prescribed for pain and inflammation.

## 2013-08-12 NOTE — ED Provider Notes (Signed)
CSN: [id number]     Arrival date & time 08/12/13  1155 History  This chart was scribed for non-physician practitioner, Evalee Jefferson, PA-C,working with Nat Christen, MD, by Marlowe Kays, ED Scribe. This patient was seen in room APFT23/APFT23 and the patient's care was started at 1:01 PM.  Chief Complaint  Patient presents with  . Hip Pain   Patient is a 53 y.o. female presenting with hip pain. The history is provided by the patient. No language interpreter was used.  Hip Pain   HPI Comments:  [name] is a 53 y.o. obese female with PMH of HTN, DM, CAD, and COPD who presents to the Emergency Department complaining of sudden onset left hip pain secondary to feeling her hip pop yesterday while walking. She states the pain is all throughout her hip now. Pt reports feeling a "knot" at her left anterior hip this morning which is currently resolved. She states walking makes the pain worse. She denies taking any medications for treatment of the pain. She denies back pain, numbness, tingling or weakness of the LLE. Pt reports allergy to Azithromycin and Tramadol. Pt states she was seeing an orthopedist, Dr. Arther Abbott, for left shoulder strain with relief with physical therapy. Pt is ambulatory with a limp in triage.  Past Medical History  Diagnosis Date  . Hypertension   . Asthma 1998  . Diabetes mellitus 2005  . Sleep apnea   . COPD (chronic obstructive pulmonary disease)   . Coronary artery disease    Past Surgical History  Procedure Laterality Date  . Cardiac stents      Two-2003;Two-2006  . Cesarean section  [id number]  . Dilation and curettage of uterus    . Tubal ligation  1990  . Cardiac catheterization  2003;2006  . Abdominal hysterectomy  04/19/2011    Procedure: HYSTERECTOMY ABDOMINAL;  Surgeon: Jonnie Kind, MD;  Location: [facility] ORS;  Service: Gynecology;  Laterality: N/A;  . Umbilical hernia repair  04/19/2011    Procedure: HERNIA REPAIR UMBILICAL ADULT;  Surgeon: Jonnie Kind, MD;  Location: [facility] ORS;  Service: Gynecology;  Laterality: N/A;  . Colonoscopy  09/20/2011    Procedure: COLONOSCOPY;  Surgeon: Daneil Dolin, MD;  Location: [facility] ENDO SUITE;  Service: Endoscopy;  Laterality: N/A;  8:30 AM   Family History  Problem Relation Age of Onset  . Heart attack Mother   . Kidney failure Father    History  Substance Use Topics  . Smoking status: Never Smoker   . Smokeless tobacco: Not on file  . Alcohol Use: No   OB History   Grav Para Term Preterm Abortions TAB SAB Ect Mult Living                 Review of Systems  Constitutional: Negative for fever.  Musculoskeletal: Positive for arthralgias and gait problem. Negative for back pain and myalgias.  Skin: Negative for wound.  Neurological: Negative for weakness and numbness.    Allergies  Azithromycin; Tramadol; and Lantus  Home Medications   Prior to Admission medications   Medication Sig Start Date End Date Taking? Authorizing Provider  acetaminophen (TYLENOL) 500 MG tablet Take 500 mg by mouth every 6 (six) hours as needed for moderate pain.   Yes Historical Provider, MD  albuterol (PROVENTIL HFA;VENTOLIN HFA) 108 (90 BASE) MCG/ACT inhaler Inhale 2 puffs into the lungs every 6 (six) hours as needed. For shortness of breath   Yes Historical Provider, MD  aspirin EC 81 MG  tablet Take 81 mg by mouth daily.   Yes Historical Provider, MD  clopidogrel (PLAVIX) 75 MG tablet Take 75 mg by mouth daily.   Yes Historical Provider, MD  Cyanocobalamin (B-12 PO) Take 1 tablet by mouth daily.   Yes Historical Provider, MD  glipiZIDE (GLUCOTROL) 10 MG tablet Take 10 mg by mouth every morning.    Yes Historical Provider, MD  metFORMIN (GLUCOPHAGE) 1000 MG tablet Take 1,000 mg by mouth 2 (two) times daily with a meal.   Yes Historical Provider, MD  metoprolol tartrate (LOPRESSOR) 25 MG tablet Take 25 mg by mouth 2 (two) times daily.   Yes Historical Provider, MD  Omega-3 Fatty Acids (FISH OIL PO) Take 2  capsules by mouth 2 (two) times daily.   Yes Historical Provider, MD  pravastatin (PRAVACHOL) 40 MG tablet Take 40 mg by mouth at bedtime.   Yes Historical Provider, MD  vitamin E 400 UNIT capsule Take 400 Units by mouth daily.   Yes Historical Provider, MD  naproxen (NAPROSYN) 500 MG tablet Take 1 tablet (500 mg total) by mouth 2 (two) times daily. 08/12/13   Evalee Jefferson, PA-C   Triage Vitals: BP 148/77  Pulse 60  Temp(Src) 99.3 F (37.4 C) (Oral)  Resp 16  Ht 4\' 11"  (1.499 m)  Wt 200 lb (90.719 kg)  BMI 40.37 kg/m2  SpO2 100%  LMP 03/26/2011 Physical Exam  Constitutional: She appears well-developed and well-nourished.  HENT:  Head: Atraumatic.  Neck: Normal range of motion.  Cardiovascular:  Pedal pulses intact and equal bilaterally.  Musculoskeletal: She exhibits tenderness.  Tender along left mid buttock area and anterolateral groin. No crepitus with ROM. Anterior pain with extreme internal and external rotation. No deformity. Knee joint nontender.  Neurological: She is alert. She has normal strength. She displays normal reflexes. No sensory deficit.  Skin: Skin is warm and dry.  Irregularly shaped 1st degree burn on right-sided abdomen with approximate 3 cm sloughed blister. No drainage or no sign of infection.  Psychiatric: She has a normal mood and affect.    ED Course  Procedures (including critical care time) DIAGNOSTIC STUDIES: Oxygen Saturation is 100% on RA, normal by my interpretation.   COORDINATION OF CARE: 1:05 PM- Will wait for X-Ray to be read. Offered pain medication but pt refused stating she does not like to take pain medication. Pt verbalizes understanding and agrees to plan.  1:55 PM- Pt also complains of burn to abdomen sustained when she spilled hot water on herself while cooking three days ago. She describes the pain as improving but wanted it looked at while she was here today. The burn originally had a blister that has since popped, draining clear fluid.  Pt reports being UTD on tetanus vaccination.  Medications  silver sulfADIAZINE (SILVADENE) 1 % cream (1 application Topical Given 08/12/13 1424)    Labs Review Labs Reviewed - No data to display  Imaging Review Dg Hip Complete Left  08/12/2013   CLINICAL DATA:  Hip pain after pushing a shopping cart. Heard a pop. Limited range of motion.  EXAM: LEFT HIP - COMPLETE 2+ VIEW  COMPARISON:  None.  FINDINGS: There is no evidence of hip fracture or dislocation. There is no evidence of arthropathy or other focal bone abnormality.  IMPRESSION: Negative.   Electronically Signed   By: Shon Hale M.D.   On: 08/12/2013 13:22     EKG Interpretation None      MDM   Final diagnoses:  Tendonitis of  left hip flexor  Burn    Pt encouraged ice packs to hip for 2 days, may add heat on day 3.  Prescribed naproxen.  Given silvadene for skin burn - first application with dressings applied here.  She was referred to her pcp for further management of her burn if it does not heal with tx.  Advised recheck of hip pain by Dr Aline Brochure if pain is not improved with tx.  Patients labs and/or radiological studies were viewed and considered during the medical decision making and disposition process. The patient appears reasonably screened and/or stabilized for discharge and I doubt any other medical condition or other [facility] requiring further screening, evaluation, or treatment in the ED at this time prior to discharge.   I personally performed the services described in this documentation, which was scribed in my presence. The recorded information has been reviewed and is accurate.    Evalee Jefferson, PA-C 08/12/13 2159

## 2013-08-12 NOTE — ED Notes (Signed)
PT states "I went to turn around and felt my hip pop." PT c/o left hip pain. PT ambulatory in triage.

## 2013-08-14 NOTE — ED Provider Notes (Signed)
Medical screening examination/treatment/procedure(s) were performed by non-physician practitioner and as supervising physician I was immediately available for consultation/collaboration.   EKG Interpretation None       Nat Christen, MD 08/14/13 1110

## 2014-01-17 ENCOUNTER — Encounter (HOSPITAL_COMMUNITY): Payer: Self-pay | Admitting: Obstetrics and Gynecology

## 2016-03-29 IMAGING — CR DG SHOULDER 2+V*L*
3 series · 3 of 3 positions shown · non-contrast
Comparison: None.

CLINICAL DATA: Shoulder pain after lifting injury.

EXAM:
LEFT SHOULDER - 2+ VIEW

[view not recorded (1 of 3)]
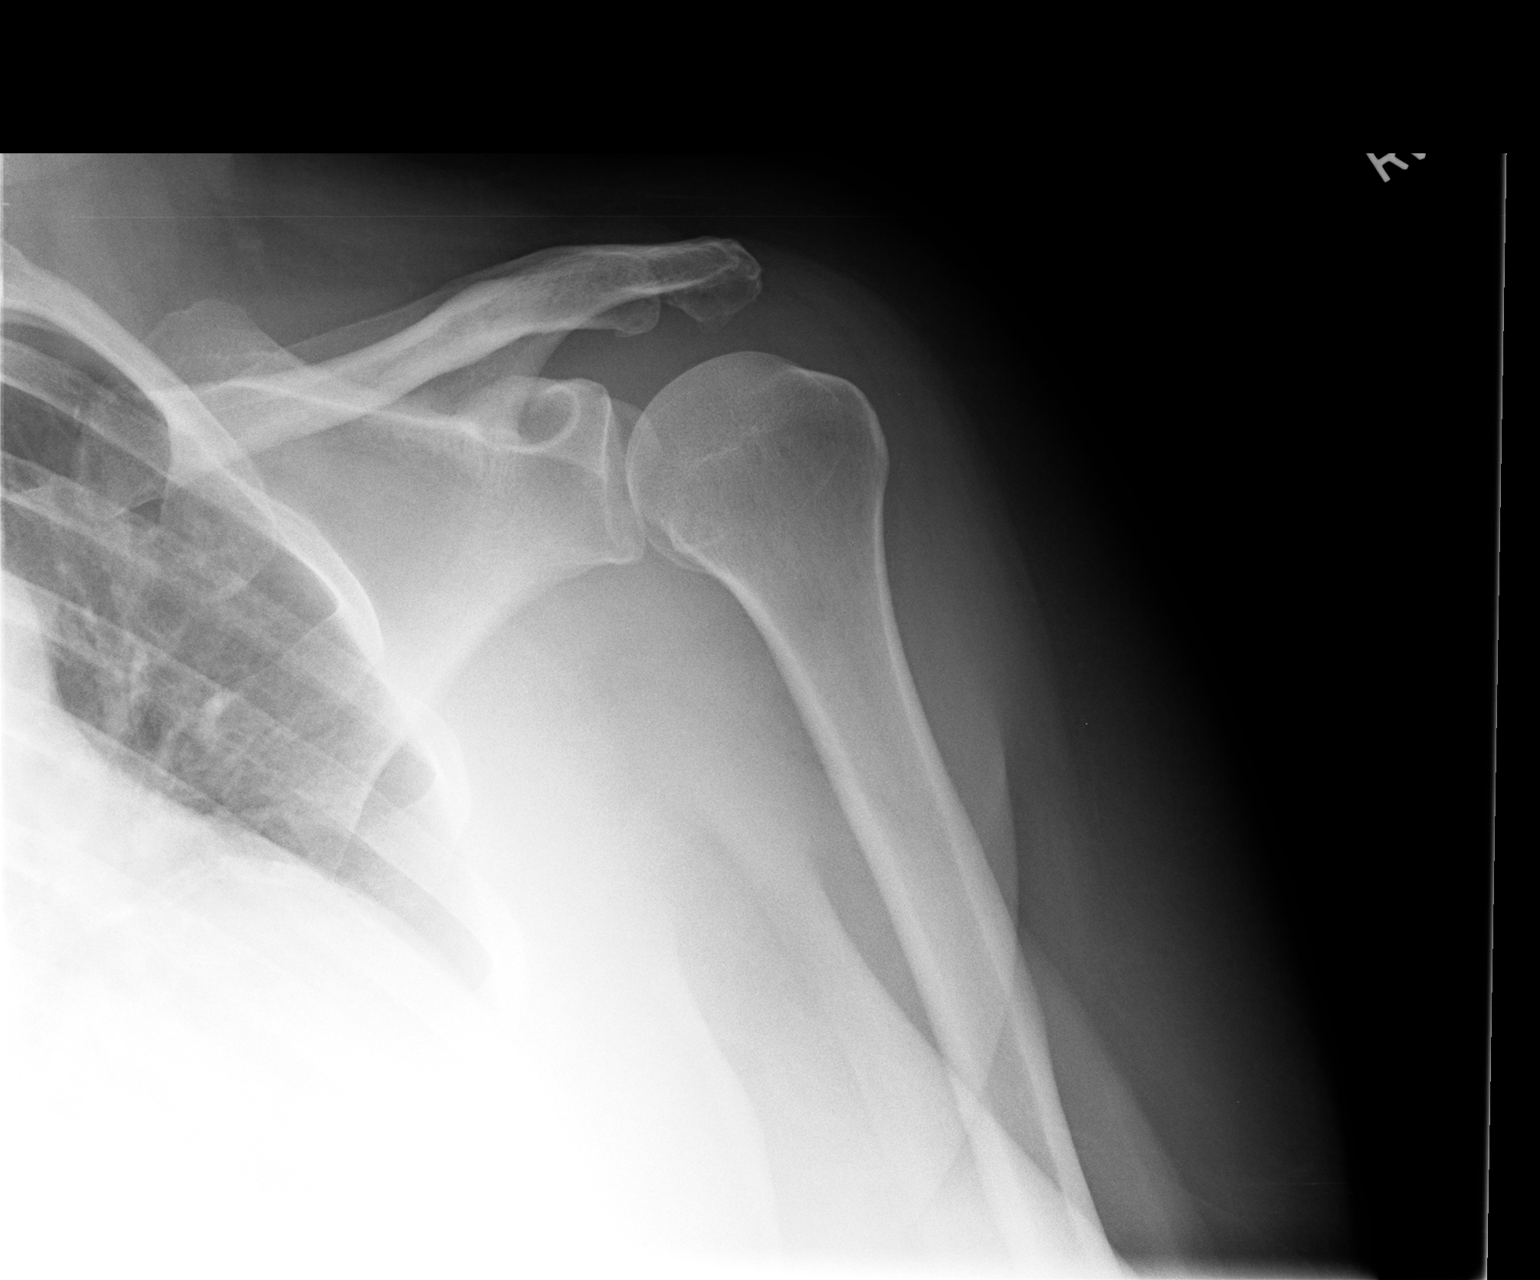

[view not recorded (2 of 3)]
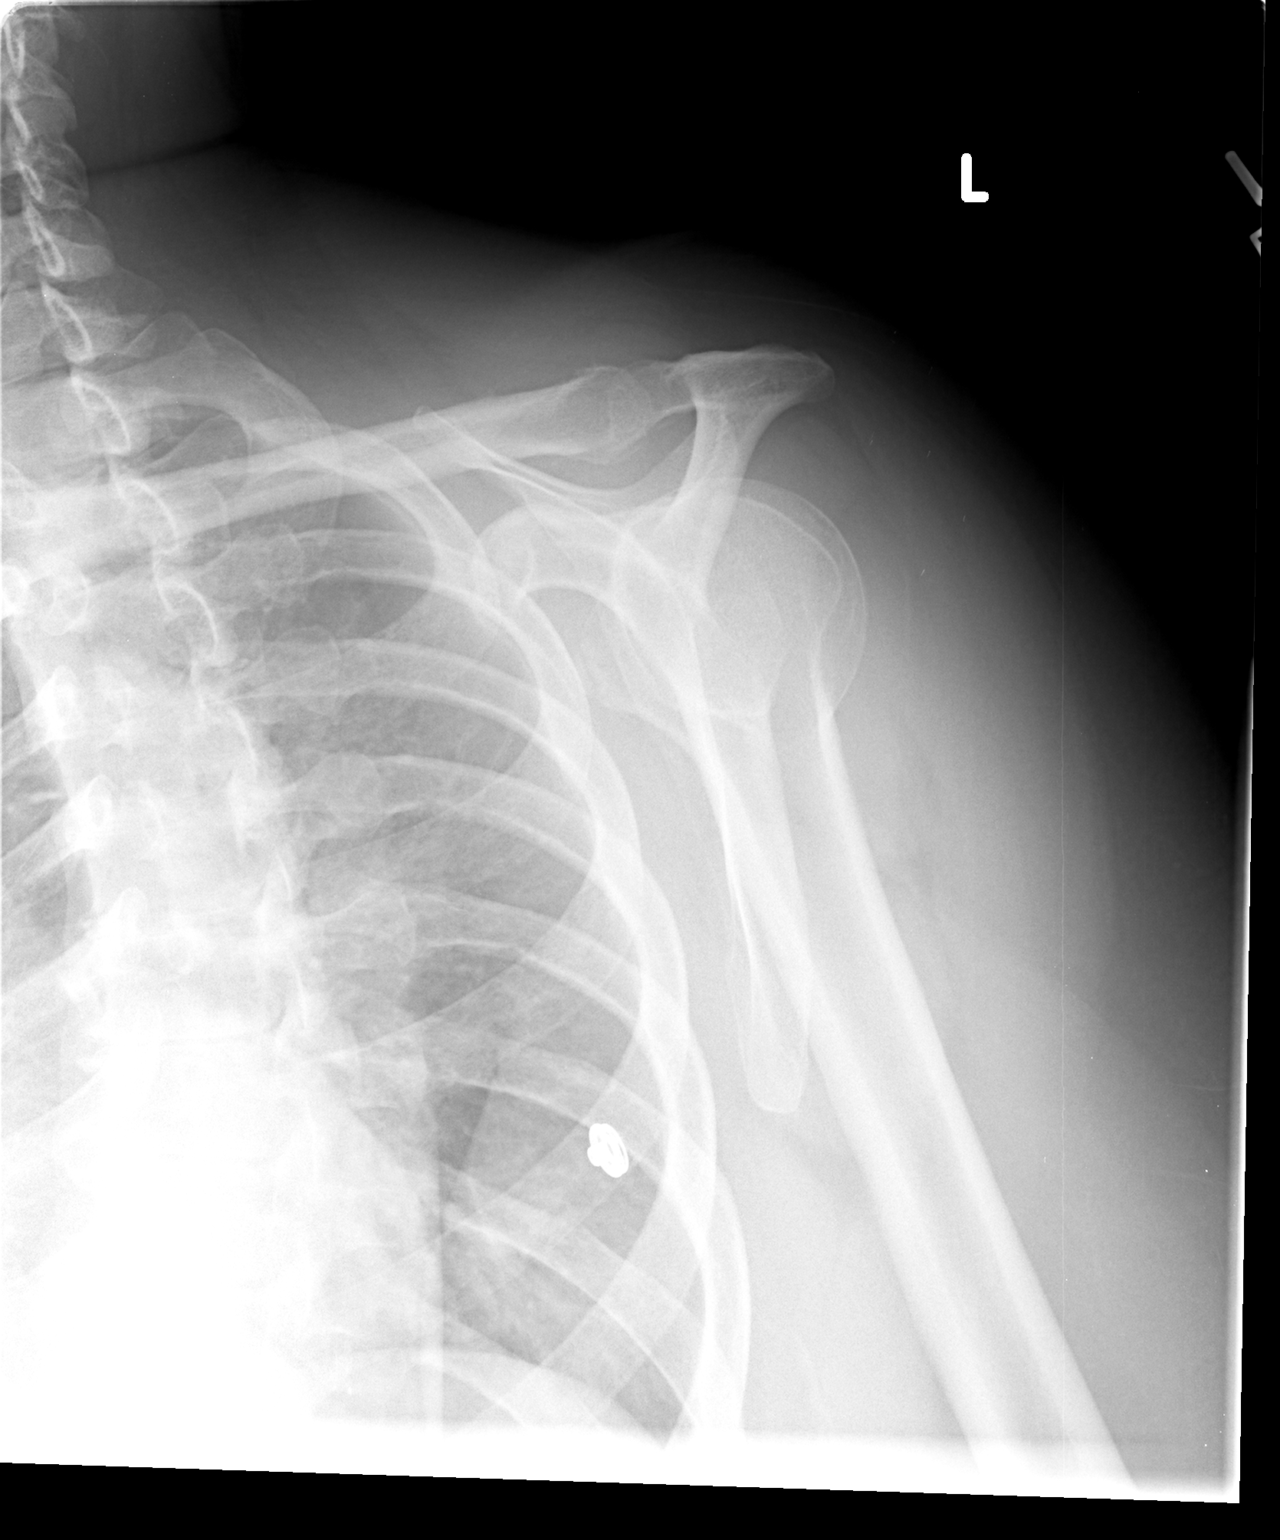

[view not recorded (3 of 3)]
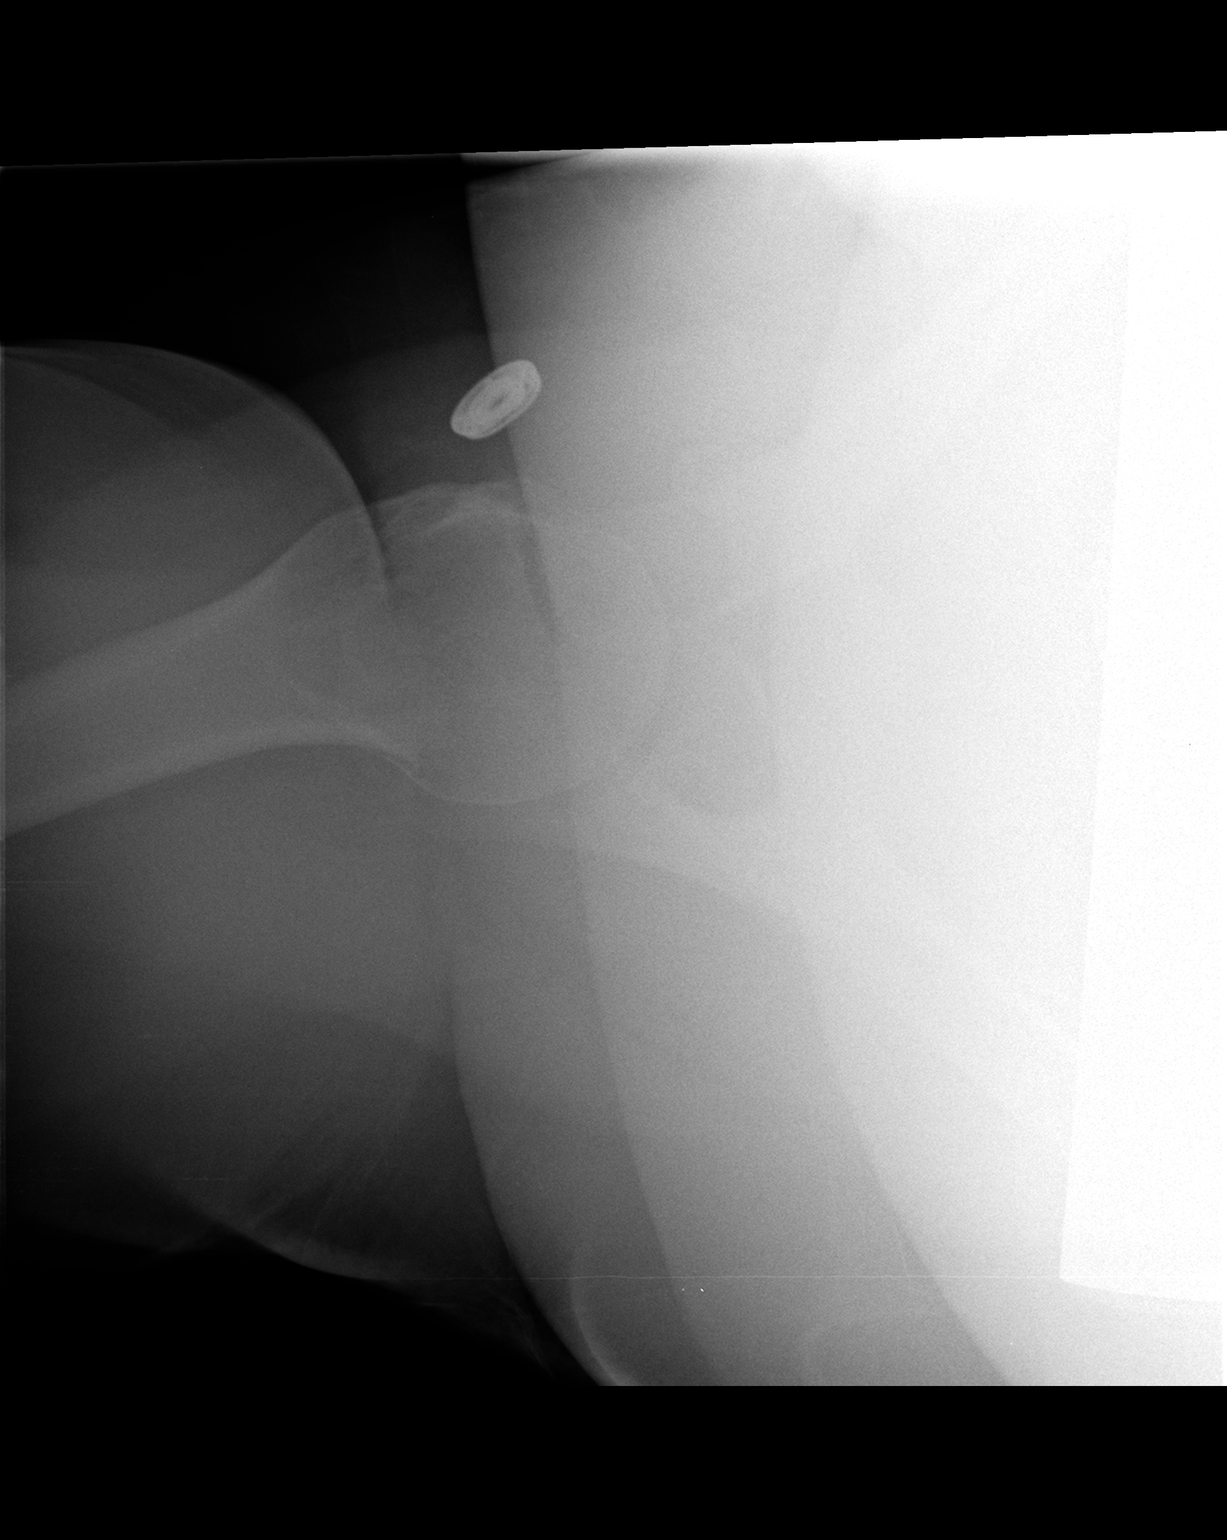

[3 of 3 positions shown; findings below may reference images not displayed]

FINDINGS: There is no evidence of fracture or dislocation. There is no
evidence of arthropathy or other focal bone abnormality. Soft
tissues are unremarkable.
IMPRESSION: Negative.

## 2020-06-04 DEATH — deceased

## 2021-08-19 ENCOUNTER — Encounter: Payer: Self-pay | Admitting: *Deleted

## 2021-10-19 ENCOUNTER — Other Ambulatory Visit (HOSPITAL_COMMUNITY): Payer: Self-pay | Admitting: Family Medicine

## 2021-10-19 ENCOUNTER — Other Ambulatory Visit (HOSPITAL_COMMUNITY): Payer: Self-pay | Admitting: Internal Medicine

## 2021-10-19 DIAGNOSIS — E237 Disorder of pituitary gland, unspecified: Secondary | ICD-10-CM

## 2021-11-13 ENCOUNTER — Encounter (HOSPITAL_COMMUNITY): Payer: Self-pay

## 2021-11-13 ENCOUNTER — Ambulatory Visit (HOSPITAL_COMMUNITY): Admission: RE | Admit: 2021-11-13 | Payer: Self-pay | Source: Ambulatory Visit

## 2022-03-23 ENCOUNTER — Ambulatory Visit (HOSPITAL_COMMUNITY)
Admission: RE | Admit: 2022-03-23 | Discharge: 2022-03-23 | Disposition: A | Discharge status: Home / Self Care | Payer: Medicare Other | Source: Ambulatory Visit | Attending: Family Medicine | Admitting: Family Medicine

## 2022-03-23 DIAGNOSIS — E237 Disorder of pituitary gland, unspecified: Secondary | ICD-10-CM | POA: Diagnosis not present

## 2022-03-23 MED ORDER — GADOBUTROL 1 MMOL/ML IV SOLN
10.0000 mL | Freq: Once | INTRAVENOUS | Status: AC | PRN
  Administered 2022-03-23: 10 mL via INTRAVENOUS

## 2023-04-18 ENCOUNTER — Other Ambulatory Visit (HOSPITAL_COMMUNITY): Payer: Self-pay | Admitting: Family Medicine

## 2023-04-18 DIAGNOSIS — E237 Disorder of pituitary gland, unspecified: Secondary | ICD-10-CM

## 2023-05-04 ENCOUNTER — Encounter (HOSPITAL_COMMUNITY): Payer: Self-pay

## 2023-05-04 ENCOUNTER — Ambulatory Visit (HOSPITAL_COMMUNITY): Payer: Self-pay | Attending: Family Medicine

## 2023-07-04 ENCOUNTER — Other Ambulatory Visit (HOSPITAL_COMMUNITY): Payer: Self-pay | Admitting: Family Medicine

## 2023-07-04 DIAGNOSIS — Z1231 Encounter for screening mammogram for malignant neoplasm of breast: Secondary | ICD-10-CM

## 2023-07-06 ENCOUNTER — Ambulatory Visit (HOSPITAL_COMMUNITY)
Admission: RE | Admit: 2023-07-06 | Discharge: 2023-07-06 | Disposition: A | Discharge status: Home / Self Care | Source: Ambulatory Visit | Attending: Family Medicine | Admitting: Family Medicine

## 2023-07-06 ENCOUNTER — Encounter (HOSPITAL_COMMUNITY): Payer: Self-pay

## 2023-07-06 DIAGNOSIS — Z1231 Encounter for screening mammogram for malignant neoplasm of breast: Secondary | ICD-10-CM | POA: Insufficient documentation

## 2023-07-13 ENCOUNTER — Inpatient Hospital Stay
Admission: RE | Admit: 2023-07-13 | Discharge: 2023-07-13 | Disposition: A | Discharge status: Home / Self Care | Payer: Self-pay | Source: Ambulatory Visit | Attending: Family Medicine

## 2023-07-13 ENCOUNTER — Inpatient Hospital Stay
Admission: RE | Admit: 2023-07-13 | Discharge: 2023-07-13 | Disposition: A | Discharge status: Home / Self Care | Payer: Self-pay | Source: Ambulatory Visit | Attending: Family Medicine | Admitting: Family Medicine

## 2023-07-13 ENCOUNTER — Other Ambulatory Visit (HOSPITAL_COMMUNITY): Payer: Self-pay | Admitting: Family Medicine

## 2023-07-13 DIAGNOSIS — Z1231 Encounter for screening mammogram for malignant neoplasm of breast: Secondary | ICD-10-CM

## 2023-07-28 ENCOUNTER — Ambulatory Visit (HOSPITAL_COMMUNITY)
Admission: RE | Admit: 2023-07-28 | Discharge: 2023-07-28 | Disposition: A | Discharge status: Home / Self Care | Source: Ambulatory Visit | Attending: Family Medicine | Admitting: Family Medicine

## 2023-07-28 DIAGNOSIS — E237 Disorder of pituitary gland, unspecified: Secondary | ICD-10-CM | POA: Diagnosis present

## 2023-07-28 MED ORDER — GADOBUTROL 1 MMOL/ML IV SOLN
9.0000 mL | Freq: Once | INTRAVENOUS | Status: AC | PRN
  Administered 2023-07-28: 9 mL via INTRAVENOUS
